# Patient Record
Sex: Female | Born: 1935 | Race: Black or African American | Hispanic: No | Marital: Single | Smoking: Never smoker
Health system: Southern US, Community
[De-identification: ages and names within clinical notes are randomized; demographics above are authoritative.]

## PROBLEM LIST (undated history)

## (undated) DIAGNOSIS — F0390 Unspecified dementia without behavioral disturbance: Secondary | ICD-10-CM

## (undated) DIAGNOSIS — E119 Type 2 diabetes mellitus without complications: Secondary | ICD-10-CM

## (undated) DIAGNOSIS — I10 Essential (primary) hypertension: Secondary | ICD-10-CM

## (undated) DIAGNOSIS — F209 Schizophrenia, unspecified: Secondary | ICD-10-CM

## (undated) DIAGNOSIS — O2230 Deep phlebothrombosis in pregnancy, unspecified trimester: Secondary | ICD-10-CM

---

## 2016-04-26 ENCOUNTER — Emergency Department (HOSPITAL_COMMUNITY)
Admission: EM | Admit: 2016-04-26 | Discharge: 2016-04-26 | Disposition: A | Discharge status: Home / Self Care | Payer: Medicare Other | Attending: Emergency Medicine | Admitting: Emergency Medicine

## 2016-04-26 ENCOUNTER — Encounter (HOSPITAL_COMMUNITY): Payer: Self-pay | Admitting: Emergency Medicine

## 2016-04-26 ENCOUNTER — Emergency Department (HOSPITAL_COMMUNITY): Payer: Medicare Other

## 2016-04-26 DIAGNOSIS — I10 Essential (primary) hypertension: Secondary | ICD-10-CM | POA: Insufficient documentation

## 2016-04-26 DIAGNOSIS — E119 Type 2 diabetes mellitus without complications: Secondary | ICD-10-CM | POA: Diagnosis not present

## 2016-04-26 DIAGNOSIS — F919 Conduct disorder, unspecified: Secondary | ICD-10-CM | POA: Diagnosis not present

## 2016-04-26 DIAGNOSIS — R531 Weakness: Secondary | ICD-10-CM | POA: Diagnosis present

## 2016-04-26 DIAGNOSIS — R930 Abnormal findings on diagnostic imaging of skull and head, not elsewhere classified: Secondary | ICD-10-CM | POA: Insufficient documentation

## 2016-04-26 DIAGNOSIS — R4689 Other symptoms and signs involving appearance and behavior: Secondary | ICD-10-CM

## 2016-04-26 HISTORY — DX: Schizophrenia, unspecified: F20.9

## 2016-04-26 HISTORY — DX: Unspecified dementia, unspecified severity, without behavioral disturbance, psychotic disturbance, mood disturbance, and anxiety: F03.90

## 2016-04-26 HISTORY — DX: Essential (primary) hypertension: I10

## 2016-04-26 HISTORY — DX: Deep phlebothrombosis in pregnancy, unspecified trimester: O22.30

## 2016-04-26 HISTORY — DX: Type 2 diabetes mellitus without complications: E11.9

## 2016-04-26 LAB — COMPREHENSIVE METABOLIC PANEL
ALBUMIN: 3.4 g/dL — AB (ref 3.5–5.0)
ALT: 16 U/L (ref 14–54)
ANION GAP: 8 (ref 5–15)
AST: 38 U/L (ref 15–41)
Alkaline Phosphatase: 62 U/L (ref 38–126)
BUN: 20 mg/dL (ref 6–20)
CHLORIDE: 104 mmol/L (ref 101–111)
CO2: 29 mmol/L (ref 22–32)
Calcium: 9.4 mg/dL (ref 8.9–10.3)
Creatinine, Ser: 1.21 mg/dL — ABNORMAL HIGH (ref 0.44–1.00)
GFR calc Af Amer: 48 mL/min — ABNORMAL LOW (ref >60)
GFR calc non Af Amer: 41 mL/min — ABNORMAL LOW (ref >60)
GLUCOSE: 76 mg/dL (ref 65–99)
POTASSIUM: 4.4 mmol/L (ref 3.5–5.1)
SODIUM: 141 mmol/L (ref 135–145)
Total Bilirubin: 1 mg/dL (ref 0.3–1.2)
Total Protein: 7.8 g/dL (ref 6.5–8.1)

## 2016-04-26 LAB — CBC
HCT: 30.8 % — ABNORMAL LOW (ref 36.0–46.0)
Hemoglobin: 10.6 g/dL — ABNORMAL LOW (ref 12.0–15.0)
MCH: 31.9 pg (ref 26.0–34.0)
MCHC: 34.4 g/dL (ref 30.0–36.0)
MCV: 92.8 fL (ref 78.0–100.0)
PLATELETS: 181 10*3/uL (ref 150–400)
RBC: 3.32 MIL/uL — AB (ref 3.87–5.11)
RDW: 16.9 % — ABNORMAL HIGH (ref 11.5–15.5)
WBC: 5.1 10*3/uL (ref 4.0–10.5)

## 2016-04-26 LAB — URINALYSIS, ROUTINE W REFLEX MICROSCOPIC
Bilirubin Urine: NEGATIVE
Glucose, UA: NEGATIVE mg/dL
Hgb urine dipstick: NEGATIVE
Ketones, ur: NEGATIVE mg/dL
LEUKOCYTES UA: NEGATIVE
NITRITE: NEGATIVE
PROTEIN: NEGATIVE mg/dL
Specific Gravity, Urine: 1.011 (ref 1.005–1.030)
pH: 8 (ref 5.0–8.0)

## 2016-04-26 LAB — CBG MONITORING, ED: GLUCOSE-CAPILLARY: 70 mg/dL (ref 65–99)

## 2016-04-26 LAB — TROPONIN I
TROPONIN I: 0.03 ng/mL — AB (ref <0.03)
Troponin I: 0.03 ng/mL (ref <0.03)

## 2016-04-26 LAB — VALPROIC ACID LEVEL: Valproic Acid Lvl: 102 ug/mL — ABNORMAL HIGH (ref 50.0–100.0)

## 2016-04-26 NOTE — ED Notes (Signed)
Bed: WHALD Expected date:  Expected time:  Means of arrival:  Comments: 

## 2016-04-26 NOTE — ED Notes (Signed)
In and out completed 1121 by Miachel RouxJonna NT and Megan EMT.

## 2016-04-26 NOTE — ED Notes (Signed)
Nurse is in the room starting a ultrasound IV

## 2016-04-26 NOTE — ED Notes (Signed)
RN attempted to start a line and collect blood work but was unable to.

## 2016-04-26 NOTE — ED Notes (Signed)
Called [facility] to inquire wait time verbalizes about an hour until [facility] able to transport pt related to wait time.

## 2016-04-26 NOTE — ED Notes (Signed)
Unsuccessful IV attempt x 2.  Ati RN to attempt.

## 2016-04-26 NOTE — ED Notes (Signed)
[facility] notified of transport back to [facility].

## 2016-04-26 NOTE — ED Notes (Signed)
Nease charge RN at bedside at present time attempt IV start.

## 2016-04-26 NOTE — ED Notes (Signed)
Faith Wright verbalizes will collect urine via in and out.

## 2016-04-26 NOTE — ED Notes (Addendum)
Attempt IV twice unsuccessful. Rees at bedside. Nease charge RN verbalizes will attempt IV.

## 2016-04-26 NOTE — ED Notes (Addendum)
Per [facility] staff Adele Barthelatricia Harrington is contact person, niece, number is [phone].  Madilyn Hookees MD notified that contact person found per facility.

## 2016-04-26 NOTE — ED Notes (Signed)
Nease charge RN attempt IV twice unsuccessful. Ati RN at bedside attempting IV at present time.

## 2016-04-26 NOTE — Discharge Instructions (Signed)
The cause of your symptoms was not identified today.  Your creatinine (kidney function was slightly elevated) as well as your depakote level.  Please follow up with your family doctor as soon as possible for recheck and to see if your labs are close to their normal ranges.  Get rechecked immediately if you develop any new or worsening symptoms.

## 2016-04-26 NOTE — ED Notes (Signed)
Per Madilyn Hookees called [facility] to investigate pt complaint. Staff verbalize "heard from someone that she shook for a few seconds during breakfast." Staff state have not contacted family related to no numbers on file. First time pt seen here, [facility] or other [facility], and have no information other than paperwork sent from facility. Pt alert to self not date of birth reports [birth year], situation at times, knows is at the hospital but not where/location, reports year 2002. Pt known hx of dementia and norm to be oriented to self only per EMS. With assessment pt verbalizes nervous and weak while eating breakfast this morning but unspecific otherwise. Pt denies pain and resting comfortably with warm blanket at present time. Nease charge RN continues to attempt IV, on 2nd attempt.

## 2016-04-26 NOTE — ED Provider Notes (Signed)
[facility]-EMERGENCY DEPT Provider Note   CSN: [id number] Arrival date & time: 04/26/16  [id number]     History   Chief Complaint Chief Complaint  Patient presents with  . Generalized Weakness    HPI Faith Wright is a [age] y.o. female.  The history is provided by the patient. No language interpreter was used.    Faith PewMarie Peckinpaugh is a 82 y.o. female who presents to the Emergency Department complaining of weakness.  Level V caveat due to dementia.  Per EMS report patient is from [facility] and had generalized weakness during breakfast this morning. Per patient she states that she felt poorly while eating breakfast described as a feeling bad and anxious type feeling. She states she felt poorly for 30 minutes and now she feels at her baseline. She denies any pain, nausea, vomiting.  Contacted [facility], they state that she had a shaking episode while she was in the dining room. Per description she was shaking all over and was speaking saying that she could not move. Episode lasted a few minutes.  Past Medical History:  Diagnosis Date  . Dementia   . Diabetes mellitus without complication (HCC)   . DVT (deep vein thrombosis) in pregnancy (HCC)   . Hypertension   . Schizophrenia (HCC)     There are no active problems to display for this patient.   History reviewed. No pertinent surgical history.  OB History    No data available       Home Medications    Prior to Admission medications   Not on File    Family History No family history on file.  Social History Social History  Substance Use Topics  . Smoking status: Unknown If Ever Smoked  . Smokeless tobacco: Not on file  . Alcohol use No     Allergies   Patient has no known allergies.   Review of Systems Review of Systems  All other systems reviewed and are negative.    Physical Exam Updated Vital Signs BP 186/68 (BP Location: Left Arm)   Pulse 78   Temp 97.8 F (36.6 C) (Oral)   Resp 16   SpO2 97%    Physical Exam  Constitutional: She appears well-developed and well-nourished.  HENT:  Head: Normocephalic and atraumatic.  Cardiovascular: Normal rate and regular rhythm.   No murmur heard. Pulmonary/Chest: Effort normal and breath sounds normal. No respiratory distress.  Abdominal: Soft. There is no tenderness. There is no rebound and no guarding.  Musculoskeletal: She exhibits no tenderness.  Trace pitting edema to BLE.  Muscular atrophy of BLE.    Neurological: She is alert.  Disoriented to place and time. 3+ out of 5 strength in bilateral lower extremities. 4 out of 5 strength in bilateral upper extremities.  Skin: Skin is warm and dry. Capillary refill takes less than 2 seconds.  Psychiatric: She has a normal mood and affect. Her behavior is normal.  Nursing note and vitals reviewed.    ED Treatments / Results  Labs (all labs ordered are listed, but only abnormal results are displayed) Labs Reviewed  BASIC METABOLIC PANEL  CBC  URINALYSIS, ROUTINE W REFLEX MICROSCOPIC (NOT AT [facility])  CBG MONITORING, ED    EKG  EKG Interpretation None       Radiology No results found.  Procedures Procedures (including critical care time)  Medications Ordered in ED Medications - No data to display   Initial Impression / Assessment and Plan / ED Course  I have reviewed the  triage vital signs and the nursing notes.  Pertinent labs & imaging results that were available during my care of the patient were reviewed by me and considered in my medical decision making (see chart for details).  Clinical Course     Patient here following an episode of shaking and feeling poorly. She is currently at her baseline in the emergency department and has no complaints. Attempted to contact patient's niece who is listed as a contact [facility] but unable to reach anyone. Her labs do show some mild abnormalities with no priors available for comparison. Presentation is not consistent with  acute coronary syndrome, PE, CVA. Plan to DC home with close outpatient follow-up with her PCP so lab reconciliation and further evaluation can occur.  Final Clinical Impressions(s) / ED Diagnoses   Final diagnoses:  Spell of abnormal behavior    New Prescriptions New Prescriptions   No medications on file     Tilden FossaElizabeth Dick Hark, MD 04/27/16 1324

## 2016-04-26 NOTE — ED Notes (Signed)
Pt gown, fill linen, and brief changed. Pt ate 100% of meal given. Pt transported to [facility] to wait for [facility] transport.

## 2016-04-26 NOTE — ED Notes (Signed)
Pt saturated in urine. Full linen changed, pericare provided, and brief applied.

## 2016-04-26 NOTE — ED Notes (Signed)
Bed: WA14 Expected date:  Expected time:  Means of arrival:  Comments: EMS 

## 2016-04-26 NOTE — ED Triage Notes (Signed)
Per EMS pt from [facility] request evaluation of generalized weakness onset this morning noted at breakfast. Hx of dementia; alert to self only.

## 2016-06-01 ENCOUNTER — Emergency Department (HOSPITAL_COMMUNITY): Payer: Medicare Other

## 2016-06-01 ENCOUNTER — Encounter (HOSPITAL_COMMUNITY): Payer: Self-pay | Admitting: Emergency Medicine

## 2016-06-01 ENCOUNTER — Emergency Department (HOSPITAL_COMMUNITY)
Admission: EM | Admit: 2016-06-01 | Discharge: 2016-06-01 | Disposition: A | Discharge status: Home / Self Care | Payer: Medicare Other | Attending: Emergency Medicine | Admitting: Emergency Medicine

## 2016-06-01 DIAGNOSIS — Y92129 Unspecified place in nursing home as the place of occurrence of the external cause: Secondary | ICD-10-CM | POA: Insufficient documentation

## 2016-06-01 DIAGNOSIS — W19XXXA Unspecified fall, initial encounter: Secondary | ICD-10-CM

## 2016-06-01 DIAGNOSIS — S7001XA Contusion of right hip, initial encounter: Secondary | ICD-10-CM | POA: Diagnosis not present

## 2016-06-01 DIAGNOSIS — Y939 Activity, unspecified: Secondary | ICD-10-CM | POA: Diagnosis not present

## 2016-06-01 DIAGNOSIS — Z7984 Long term (current) use of oral hypoglycemic drugs: Secondary | ICD-10-CM | POA: Insufficient documentation

## 2016-06-01 DIAGNOSIS — Y999 Unspecified external cause status: Secondary | ICD-10-CM | POA: Insufficient documentation

## 2016-06-01 DIAGNOSIS — Z79899 Other long term (current) drug therapy: Secondary | ICD-10-CM | POA: Insufficient documentation

## 2016-06-01 DIAGNOSIS — E119 Type 2 diabetes mellitus without complications: Secondary | ICD-10-CM | POA: Insufficient documentation

## 2016-06-01 DIAGNOSIS — Z7982 Long term (current) use of aspirin: Secondary | ICD-10-CM | POA: Insufficient documentation

## 2016-06-01 DIAGNOSIS — S79911A Unspecified injury of right hip, initial encounter: Secondary | ICD-10-CM | POA: Diagnosis present

## 2016-06-01 DIAGNOSIS — I10 Essential (primary) hypertension: Secondary | ICD-10-CM | POA: Insufficient documentation

## 2016-06-01 DIAGNOSIS — W1830XA Fall on same level, unspecified, initial encounter: Secondary | ICD-10-CM | POA: Insufficient documentation

## 2016-06-01 DIAGNOSIS — S7000XA Contusion of unspecified hip, initial encounter: Secondary | ICD-10-CM

## 2016-06-01 LAB — CBG MONITORING, ED: GLUCOSE-CAPILLARY: 135 mg/dL — AB (ref 65–99)

## 2016-06-01 NOTE — Progress Notes (Signed)
Pt's Faith Wright snf forms indicates her pcp is Otelia SergeantDavid Curly  [facility] pcp information is updated

## 2016-06-01 NOTE — ED Triage Notes (Addendum)
Pt from [facility]. Hx of dementia per history. Facility reports pt is normally alert and oriented 4x, but today only oriented to self.  Pt had a fall at the facility today. Was witnessed by roommate who refused to speak with EMS. Pt normally non-ambulatory, but staff reports that pt's R hip looks different than normal today. Pt will not straighten legs (unusual for her) and has pain with passive extension.  Also has swelling on L foot. Pt also complained of lumbar pain with EMS.  Hx of recurrent UTI. Recently finished abx. Pt otherwise complains of abd pain.

## 2016-06-01 NOTE — ED Notes (Signed)
Bed: [facility]ALA Expected date:  Expected time:  Means of arrival:  Comments: No bed. 

## 2016-06-01 NOTE — ED Notes (Signed)
[facility] called for transport.  

## 2016-06-01 NOTE — ED Notes (Signed)
Bed: WA03 Expected date:  Expected time:  Means of arrival:  Comments: EMS-[facility]-fall/hip deformity

## 2016-06-01 NOTE — ED Provider Notes (Signed)
[facility]-EMER[facility] DEPT Provider Note   CSN: [id number] Arrival date & time: 06/01/16  [id number]     History   Chief Complaint Chief Complaint  Patient presents with  . Hip Pain  . Fall    HPI Faith Wright is a 82 y.o. female.  Patient is an 82 year old female with past medical history of dementia, schizophrenia, diabetes, and hypertension. She presents for evaluation of a fall. She is a resident of a nursing facility where she was apparently found on the floor. The patient offers little history, however appears to be having pain in her right hip.    Hip Pain   Fall     Past Medical History:  Diagnosis Date  . Dementia   . Diabetes mellitus without complication (HCC)   . DVT (deep vein thrombosis) in pregnancy (HCC)   . Hypertension   . Schizophrenia (HCC)     There are no active problems to display for this patient.   History reviewed. No pertinent surgical history.  OB History    No data available       Home Medications    Prior to Admission medications   Medication Sig Start Date End Date Taking? Authorizing Provider  amLODipine (NORVASC) 5 MG tablet Take 5 mg by mouth every morning.    Historical Provider, MD  aspirin EC 81 MG tablet Take 81 mg by mouth every morning.    Historical Provider, MD  benazepril (LOTENSIN) 20 MG tablet Take 20 mg by mouth every morning.    Historical Provider, MD  divalproex (DEPAKOTE) 500 MG DR tablet Take 500 mg by mouth 2 (two) times daily.    Historical Provider, MD  donepezil (ARICEPT) 10 MG tablet Take 10 mg by mouth at bedtime.    Historical Provider, MD  Lurasidone HCl (LATUDA) 60 MG TABS Take 60 mg by mouth every morning.    Historical Provider, MD  metFORMIN (GLUCOPHAGE) 500 MG tablet Take 500 mg by mouth 2 (two) times daily with a meal.    Historical Provider, MD  polyvinyl alcohol-povidone (HYPOTEARS) 1.4-0.6 % ophthalmic solution Place 1-2 drops into both eyes 3 (three) times daily.    Historical Provider, MD    pravastatin (PRAVACHOL) 40 MG tablet Take 40 mg by mouth at bedtime.    Historical Provider, MD  vitamin B-12 (CYANOCOBALAMIN) 1000 MCG tablet Take 1,000 mcg by mouth every morning.    Historical Provider, MD    Family History History reviewed. No pertinent family history.  Social History Social History  Substance Use Topics  . Smoking status: Unknown If Ever Smoked  . Smokeless tobacco: Not on file  . Alcohol use No     Allergies   Patient has no known allergies.   Review of Systems Review of Systems  Unable to perform ROS: Dementia     Physical Exam Updated Vital Signs BP 199/94 (BP Location: Right Arm)   Pulse 80   Temp 98.5 F (36.9 C) (Oral)   Resp 16   SpO2 93%   Physical Exam  Constitutional: She appears well-developed and well-nourished. No distress.  HENT:  Head: Normocephalic and atraumatic.  Neck: Normal range of motion. Neck supple.  Cardiovascular: Normal rate and regular rhythm.  Exam reveals no gallop and no friction rub.   No murmur heard. Pulmonary/Chest: Effort normal and breath sounds normal. No respiratory distress. She has no wheezes.  Abdominal: Soft. Bowel sounds are normal. She exhibits no distension. There is no tenderness.  Musculoskeletal: Normal range of motion.  There appears to be tenderness over the right hip, however no obvious deformity. She has good range of motion. Distal PMS is intact.  Neurological: She is alert. No cranial nerve deficit. She exhibits normal muscle tone. Coordination normal.  Patient is awake, alert, and pleasant. She is not oriented to time, place, or situation. She moves all extremities and follows commands. She responds to questions appropriately.  Skin: Skin is warm and dry. She is not diaphoretic.  Nursing note and vitals reviewed.    ED Treatments / Results  Labs (all labs ordered are listed, but only abnormal results are displayed) Labs Reviewed  CBG MONITORING, ED - Abnormal; Notable for the  following:       Result Value   Glucose-Capillary 135 (*)    All other components within normal limits    EKG  EKG Interpretation None       Radiology No results found.  Procedures Procedures (including critical care time)  Medications Ordered in ED Medications - No data to display   Initial Impression / Assessment and Plan / ED Course  I have reviewed the triage vital signs and the nursing notes.  Pertinent labs & imaging results that were available during my care of the patient were reviewed by me and considered in my medical decision making (see chart for details).  Clinical Course     Patient brought here by EMS after a fall at a nursing home. I am unable to identify any obvious hip fracture on x-ray. She is somewhat demented which limits history somewhat, however she is not reporting to me any obvious complaints. She appears to be moving all 4 extremities and from what I can tell in prior notes, is at her baseline. I see no indication for further imaging and believe she is appropriate for discharge.  Final Clinical Impressions(s) / ED Diagnoses   Final diagnoses:  None    New Prescriptions New Prescriptions   No medications on file     Geoffery Lyonsouglas Garritt Molyneux, MD 06/01/16 1116

## 2016-06-01 NOTE — Progress Notes (Signed)
ED CM consulted by pt's community hosp[facility] staff to get updated States the family had discussed they had not received an update EDP completing ED notes as hospice staff called at 1116 therefore discharge order populated  Confirmed with ED clerk that ED RN notify facility of update and facility updates family

## 2016-06-04 ENCOUNTER — Emergency Department (HOSPITAL_COMMUNITY)
Admission: EM | Admit: 2016-06-04 | Discharge: 2016-06-04 | Disposition: A | Discharge status: Home / Self Care | Payer: Medicare Other | Attending: Emergency Medicine | Admitting: Emergency Medicine

## 2016-06-04 ENCOUNTER — Emergency Department (HOSPITAL_COMMUNITY): Payer: Medicare Other

## 2016-06-04 ENCOUNTER — Encounter (HOSPITAL_COMMUNITY): Payer: Self-pay | Admitting: Emergency Medicine

## 2016-06-04 DIAGNOSIS — I10 Essential (primary) hypertension: Secondary | ICD-10-CM | POA: Insufficient documentation

## 2016-06-04 DIAGNOSIS — W19XXXA Unspecified fall, initial encounter: Secondary | ICD-10-CM | POA: Diagnosis not present

## 2016-06-04 DIAGNOSIS — Y939 Activity, unspecified: Secondary | ICD-10-CM | POA: Diagnosis not present

## 2016-06-04 DIAGNOSIS — M25561 Pain in right knee: Secondary | ICD-10-CM | POA: Insufficient documentation

## 2016-06-04 DIAGNOSIS — Y929 Unspecified place or not applicable: Secondary | ICD-10-CM | POA: Insufficient documentation

## 2016-06-04 DIAGNOSIS — E119 Type 2 diabetes mellitus without complications: Secondary | ICD-10-CM | POA: Diagnosis not present

## 2016-06-04 DIAGNOSIS — Y999 Unspecified external cause status: Secondary | ICD-10-CM | POA: Insufficient documentation

## 2016-06-04 DIAGNOSIS — Z7982 Long term (current) use of aspirin: Secondary | ICD-10-CM | POA: Insufficient documentation

## 2016-06-04 DIAGNOSIS — M25562 Pain in left knee: Secondary | ICD-10-CM | POA: Diagnosis not present

## 2016-06-04 LAB — CBC WITH DIFFERENTIAL/PLATELET
BASOS ABS: 0 10*3/uL (ref 0.0–0.1)
Basophils Relative: 0 %
EOS PCT: 0 %
Eosinophils Absolute: 0 10*3/uL (ref 0.0–0.7)
HCT: 28.5 % — ABNORMAL LOW (ref 36.0–46.0)
Hemoglobin: 9.5 g/dL — ABNORMAL LOW (ref 12.0–15.0)
LYMPHS PCT: 19 %
Lymphs Abs: 1.7 10*3/uL (ref 0.7–4.0)
MCH: 32 pg (ref 26.0–34.0)
MCHC: 33.3 g/dL (ref 30.0–36.0)
MCV: 96 fL (ref 78.0–100.0)
Monocytes Absolute: 0.7 10*3/uL (ref 0.1–1.0)
Monocytes Relative: 8 %
Neutro Abs: 6.6 10*3/uL (ref 1.7–7.7)
Neutrophils Relative %: 73 %
Platelets: 243 10*3/uL (ref 150–400)
RBC: 2.97 MIL/uL — AB (ref 3.87–5.11)
RDW: 15.9 % — ABNORMAL HIGH (ref 11.5–15.5)
WBC: 9 10*3/uL (ref 4.0–10.5)

## 2016-06-04 LAB — BASIC METABOLIC PANEL
ANION GAP: 8 (ref 5–15)
BUN: 27 mg/dL — AB (ref 6–20)
CO2: 30 mmol/L (ref 22–32)
Calcium: 9 mg/dL (ref 8.9–10.3)
Chloride: 105 mmol/L (ref 101–111)
Creatinine, Ser: 1 mg/dL (ref 0.44–1.00)
GFR, EST NON AFRICAN AMERICAN: 52 mL/min — AB (ref >60)
Glucose, Bld: 157 mg/dL — ABNORMAL HIGH (ref 65–99)
POTASSIUM: 3.8 mmol/L (ref 3.5–5.1)
SODIUM: 143 mmol/L (ref 135–145)

## 2016-06-04 LAB — I-STAT TROPONIN, ED: TROPONIN I, POC: 0 ng/mL (ref 0.00–0.08)

## 2016-06-04 MED ORDER — ACETAMINOPHEN 325 MG PO TABS
650.0000 mg | ORAL_TABLET | Freq: Once | ORAL | Status: AC
  Administered 2016-06-04: 650 mg via ORAL
  Filled 2016-06-04: qty 2

## 2016-06-04 NOTE — ED Provider Notes (Signed)
[facility]-EM[facility] DEPT Provider Note   CSN: [id number] Arrival date & time: 06/04/16  1618     History   Chief Complaint Chief Complaint  Patient presents with  . Knee Pain  . Fall    HPI Faith Wright is a 82 y.o. female.  HPI  Remainder of history, ROS, and physical exam limited due to patient's condition (dementia).  Level V Caveat.  Patient sent here by her skilled nursing facility for left knee pain and noted bruising of the left knee after a fall 3 days ago.  No other falls. Patient currently at baseline.   Past Medical History:  Diagnosis Date  . Dementia   . Diabetes mellitus without complication (HCC)   . DVT (deep vein thrombosis) in pregnancy (HCC)   . Hypertension   . Schizophrenia (HCC)     There are no active problems to display for this patient.   History reviewed. No pertinent surgical history.  OB History    No data available       Home Medications    Prior to Admission medications   Medication Sig Start Date End Date Taking? Authorizing Provider  acetaminophen (TYLENOL) 325 MG tablet Take 650 mg by mouth 2 (two) times daily as needed for mild pain, moderate pain, fever or headache.   Yes Historical Provider, MD  acetaminophen (TYLENOL) 500 MG tablet Take 1,000 mg by mouth 3 (three) times daily as needed for mild pain, moderate pain, fever or headache.   Yes Historical Provider, MD  aspirin EC 81 MG tablet Take 81 mg by mouth every morning.   Yes Historical Provider, MD  benazepril (LOTENSIN) 10 MG tablet Take 10 mg by mouth daily with breakfast.   Yes Historical Provider, MD  benzonatate (TESSALON) 100 MG capsule Take 100 mg by mouth 3 (three) times daily as needed for cough.   Yes Historical Provider, MD  citalopram (CELEXA) 10 MG tablet Take 10 mg by mouth daily with breakfast.   Yes Historical Provider, MD  divalproex (DEPAKOTE) 500 MG DR tablet Take 500 mg by mouth 2 (two) times daily.   Yes Historical Provider, MD  loratadine (CLARITIN) 10  MG tablet Take 10 mg by mouth daily as needed for allergies.   Yes Historical Provider, MD  Lurasidone HCl (LATUDA) 60 MG TABS Take 60 mg by mouth every morning.   Yes Historical Provider, MD  metFORMIN (GLUCOPHAGE) 500 MG tablet Take 500 mg by mouth daily with breakfast.    Yes Historical Provider, MD  polyvinyl alcohol-povidone (HYPOTEARS) 1.4-0.6 % ophthalmic solution Place 1 drop into both eyes 3 (three) times daily.    Yes Historical Provider, MD  vitamin B-12 (CYANOCOBALAMIN) 1000 MCG tablet Take 1,000 mcg by mouth every morning.   Yes Historical Provider, MD    Family History No family history on file.  Social History Social History  Substance Use Topics  . Smoking status: Unknown If Ever Smoked  . Smokeless tobacco: Never Used  . Alcohol use No     Allergies   Patient has no known allergies.   Review of Systems Review of Systems  Unable to perform ROS: Dementia     Physical Exam Updated Vital Signs BP 188/86 (BP Location: Left Arm)   Pulse 100   Temp 98.6 F (37 C) (Oral)   Resp 18   SpO2 100%   Vitals:   06/04/16 2000 06/04/16 2100 06/04/16 2130 06/04/16 2213  BP: 188/86 (!) 193/104 189/87 188/86  Pulse:    100  Resp: 17 25 22 18   Temp:    98.6 F (37 C)  TempSrc:    Oral  SpO2:    100%    Physical Exam  Constitutional: She is oriented to person, place, and time. She appears well-developed and well-nourished. No distress.  HENT:  Head: Normocephalic and atraumatic.  Nose: Nose normal.  Eyes: Conjunctivae and EOM are normal. Pupils are equal, round, and reactive to light. Right eye exhibits no discharge. Left eye exhibits no discharge. No scleral icterus.  Neck: Normal range of motion. Neck supple.  Cardiovascular: Normal rate and regular rhythm.  Exam reveals no gallop and no friction rub.   No murmur heard. Pulmonary/Chest: Effort normal and breath sounds normal. No stridor. No respiratory distress. She has no rales.  Abdominal: Soft. She exhibits  no distension. There is no tenderness.  Musculoskeletal: She exhibits no edema.       Right knee: Tenderness found.       Left knee: Tenderness found.  Neurological: She is alert and oriented to person, place, and time.  Skin: Skin is warm and dry. No rash noted. She is not diaphoretic. No erythema.  Psychiatric: She has a normal mood and affect.  Vitals reviewed.    ED Treatments / Results  Labs (all labs ordered are listed, but only abnormal results are displayed) Labs Reviewed  BASIC METABOLIC PANEL - Abnormal; Notable for the following:       Result Value   Glucose, Bld 157 (*)    BUN 27 (*)    GFR calc non Af Amer 52 (*)    All other components within normal limits  CBC WITH DIFFERENTIAL/PLATELET - Abnormal; Notable for the following:    RBC 2.97 (*)    Hemoglobin 9.5 (*)    HCT 28.5 (*)    RDW 15.9 (*)    All other components within normal limits  I-STAT TROPOININ, ED    EKG  EKG Interpretation  Date/Time:  Friday June 04 2016 20:37:14 EST Ventricular Rate:  93 PR Interval:    QRS Duration: 82 QT Interval:  348 QTC Calculation: 433 R Axis:   -26 Text Interpretation:  Sinus rhythm Probable left atrial enlargement LVH with secondary repolarization abnormality Anterior Q waves, possibly due to LVH No significant change since last tracing Confirmed by [facility] MD, Josefita Weissmann ([id number]) on 06/04/2016 9:18:05 PM       Radiology Dg Knee 2 Views Left  Result Date: 06/04/2016 CLINICAL DATA:  Larey SeatFell 3 days ago.  Ecchymosis and soreness. EXAM: LEFT KNEE - 1-2 VIEW COMPARISON:  None. FINDINGS: Marked study limitations due to flexion contractures and positioning challenges. No acute fracture or dislocation is evident. No radiopaque foreign body. No soft tissue gas. IMPRESSION: Limited study, negative for significant abnormality. Electronically Signed   By: Ellery Plunkaniel R Mitchell M.D.   On: 06/04/2016 19:16   Dg Knee 2 Views Right  Result Date: 06/04/2016 CLINICAL DATA:  Larey SeatFell  several days ago.  Swelling and ecchymosis. EXAM: RIGHT KNEE - 1-2 VIEW COMPARISON:  None. FINDINGS: Significant study limitations due to flexion contractures and positioning challenges. No fracture or dislocation is evident. No soft tissue gas or foreign body. IMPRESSION: Limited study, negative for significant abnormality. Electronically Signed   By: Ellery Plunkaniel R Mitchell M.D.   On: 06/04/2016 19:25    Procedures Procedures (including critical care time)  Medications Ordered in ED Medications  acetaminophen (TYLENOL) tablet 650 mg (650 mg Oral Given 06/04/16 2210)     Initial Impression / Assessment  and Plan / ED Course  I have reviewed the triage vital signs and the nursing notes.  Pertinent labs & imaging results that were available during my care of the patient were reviewed by me and considered in my medical decision making (see chart for details).  Clinical Course as of Jun 06 323  Fri Jun 04, 2016  1900 Plain films of the knees without evidence of acute injury.  [PC]  2201 Labs without evidence of end organ damage. EKG without acute ischemic changes. Patient already on ACE inhibitor. Will need to follow up with primary care provider for appropriate control of hypertension.  [PC]  2201 The patient is safe for discharge with strict return precautions.   [PC]    Clinical Course User Index [PC] Nira Conn, MD      Final Clinical Impressions(s) / ED Diagnoses   Final diagnoses:  Acute pain of both knees  Hypertension, unspecified type   Disposition: Discharge  Condition: Good  I have discussed the results, Dx and Tx plan with the patient who expressed understanding and agree(s) with the plan. Discharge instructions discussed at great length. The patient was given strict return precautions who verbalized understanding of the instructions. No further questions at time of discharge.    Discharge Medication List as of 06/04/2016 10:00 PM      Follow Up: Mortimer Fries, PA [address] [place] [address] [phone]  Schedule an appointment as soon as possible for a visit  For close follow up to assess for uncontrolled hypertension      Nira Conn, MD 06/05/16 [phone]

## 2016-06-04 NOTE — ED Triage Notes (Signed)
Pt from [facility] via [facility] for recheck on L knee after being seen here for a fall 3 days ago. Facility staff reports that pt has bruising to L knee and soreness.  Pt is at baseline per facility with hx of dementia. Pt in NAD. Pt is hypertensive with hx of the same. Facility reports that pt has been taking BP meds

## 2016-06-04 NOTE — ED Notes (Signed)
[facility] called and report was given to [place] at [facility].

## 2016-06-10 ENCOUNTER — Inpatient Hospital Stay (HOSPITAL_COMMUNITY)
Admission: EM | Admit: 2016-06-10 | Discharge: 2016-06-16 | DRG: 871 | Disposition: A | Discharge status: Hospice | Payer: Medicare Other | Attending: Family Medicine | Admitting: Family Medicine

## 2016-06-10 ENCOUNTER — Encounter (HOSPITAL_COMMUNITY): Payer: Self-pay | Admitting: Emergency Medicine

## 2016-06-10 ENCOUNTER — Inpatient Hospital Stay (HOSPITAL_COMMUNITY): Payer: Medicare Other

## 2016-06-10 ENCOUNTER — Emergency Department (HOSPITAL_COMMUNITY): Payer: Medicare Other

## 2016-06-10 DIAGNOSIS — Z515 Encounter for palliative care: Secondary | ICD-10-CM | POA: Diagnosis present

## 2016-06-10 DIAGNOSIS — E860 Dehydration: Secondary | ICD-10-CM | POA: Diagnosis present

## 2016-06-10 DIAGNOSIS — N843 Polyp of vulva: Secondary | ICD-10-CM | POA: Diagnosis not present

## 2016-06-10 DIAGNOSIS — F0390 Unspecified dementia without behavioral disturbance: Secondary | ICD-10-CM | POA: Insufficient documentation

## 2016-06-10 DIAGNOSIS — R630 Anorexia: Secondary | ICD-10-CM | POA: Diagnosis not present

## 2016-06-10 DIAGNOSIS — D539 Nutritional anemia, unspecified: Secondary | ICD-10-CM | POA: Diagnosis present

## 2016-06-10 DIAGNOSIS — F203 Undifferentiated schizophrenia: Secondary | ICD-10-CM

## 2016-06-10 DIAGNOSIS — F0280 Dementia in other diseases classified elsewhere without behavioral disturbance: Secondary | ICD-10-CM | POA: Diagnosis present

## 2016-06-10 DIAGNOSIS — I4891 Unspecified atrial fibrillation: Secondary | ICD-10-CM | POA: Diagnosis not present

## 2016-06-10 DIAGNOSIS — Z7189 Other specified counseling: Secondary | ICD-10-CM

## 2016-06-10 DIAGNOSIS — A419 Sepsis, unspecified organism: Secondary | ICD-10-CM | POA: Diagnosis present

## 2016-06-10 DIAGNOSIS — G309 Alzheimer's disease, unspecified: Secondary | ICD-10-CM | POA: Diagnosis present

## 2016-06-10 DIAGNOSIS — F209 Schizophrenia, unspecified: Secondary | ICD-10-CM | POA: Diagnosis present

## 2016-06-10 DIAGNOSIS — Z79891 Long term (current) use of opiate analgesic: Secondary | ICD-10-CM

## 2016-06-10 DIAGNOSIS — I480 Paroxysmal atrial fibrillation: Secondary | ICD-10-CM | POA: Diagnosis present

## 2016-06-10 DIAGNOSIS — E861 Hypovolemia: Secondary | ICD-10-CM | POA: Diagnosis present

## 2016-06-10 DIAGNOSIS — I10 Essential (primary) hypertension: Secondary | ICD-10-CM | POA: Diagnosis not present

## 2016-06-10 DIAGNOSIS — R9431 Abnormal electrocardiogram [ECG] [EKG]: Secondary | ICD-10-CM | POA: Diagnosis not present

## 2016-06-10 DIAGNOSIS — K5900 Constipation, unspecified: Secondary | ICD-10-CM | POA: Diagnosis present

## 2016-06-10 DIAGNOSIS — G40909 Epilepsy, unspecified, not intractable, without status epilepticus: Secondary | ICD-10-CM

## 2016-06-10 DIAGNOSIS — E43 Unspecified severe protein-calorie malnutrition: Secondary | ICD-10-CM | POA: Diagnosis present

## 2016-06-10 DIAGNOSIS — R627 Adult failure to thrive: Secondary | ICD-10-CM | POA: Diagnosis present

## 2016-06-10 DIAGNOSIS — E876 Hypokalemia: Secondary | ICD-10-CM | POA: Diagnosis present

## 2016-06-10 DIAGNOSIS — E119 Type 2 diabetes mellitus without complications: Secondary | ICD-10-CM

## 2016-06-10 DIAGNOSIS — Z66 Do not resuscitate: Secondary | ICD-10-CM | POA: Diagnosis present

## 2016-06-10 DIAGNOSIS — Z9181 History of falling: Secondary | ICD-10-CM

## 2016-06-10 DIAGNOSIS — N179 Acute kidney failure, unspecified: Secondary | ICD-10-CM

## 2016-06-10 DIAGNOSIS — R740 Nonspecific elevation of levels of transaminase and lactic acid dehydrogenase [LDH]: Secondary | ICD-10-CM | POA: Diagnosis present

## 2016-06-10 DIAGNOSIS — Z7982 Long term (current) use of aspirin: Secondary | ICD-10-CM

## 2016-06-10 DIAGNOSIS — I080 Rheumatic disorders of both mitral and aortic valves: Secondary | ICD-10-CM | POA: Diagnosis present

## 2016-06-10 DIAGNOSIS — E1151 Type 2 diabetes mellitus with diabetic peripheral angiopathy without gangrene: Secondary | ICD-10-CM | POA: Diagnosis present

## 2016-06-10 DIAGNOSIS — E870 Hyperosmolality and hypernatremia: Secondary | ICD-10-CM | POA: Diagnosis present

## 2016-06-10 DIAGNOSIS — Z7984 Long term (current) use of oral hypoglycemic drugs: Secondary | ICD-10-CM

## 2016-06-10 DIAGNOSIS — Z79899 Other long term (current) drug therapy: Secondary | ICD-10-CM | POA: Diagnosis not present

## 2016-06-10 DIAGNOSIS — M6282 Rhabdomyolysis: Secondary | ICD-10-CM | POA: Diagnosis present

## 2016-06-10 DIAGNOSIS — R4182 Altered mental status, unspecified: Secondary | ICD-10-CM | POA: Diagnosis present

## 2016-06-10 DIAGNOSIS — B349 Viral infection, unspecified: Secondary | ICD-10-CM | POA: Diagnosis present

## 2016-06-10 DIAGNOSIS — R6521 Severe sepsis with septic shock: Secondary | ICD-10-CM | POA: Diagnosis present

## 2016-06-10 DIAGNOSIS — R748 Abnormal levels of other serum enzymes: Secondary | ICD-10-CM | POA: Diagnosis present

## 2016-06-10 DIAGNOSIS — R7401 Elevation of levels of liver transaminase levels: Secondary | ICD-10-CM | POA: Diagnosis present

## 2016-06-10 DIAGNOSIS — R509 Fever, unspecified: Secondary | ICD-10-CM

## 2016-06-10 DIAGNOSIS — R102 Pelvic and perineal pain: Secondary | ICD-10-CM | POA: Diagnosis present

## 2016-06-10 DIAGNOSIS — Z87440 Personal history of urinary (tract) infections: Secondary | ICD-10-CM

## 2016-06-10 LAB — COMPREHENSIVE METABOLIC PANEL
ALBUMIN: 2.5 g/dL — AB (ref 3.5–5.0)
ALK PHOS: 53 U/L (ref 38–126)
ALT: 25 U/L (ref 14–54)
AST: 58 U/L — AB (ref 15–41)
Anion gap: 17 — ABNORMAL HIGH (ref 5–15)
BILIRUBIN TOTAL: 0.9 mg/dL (ref 0.3–1.2)
BUN: 73 mg/dL — AB (ref 6–20)
CO2: 26 mmol/L (ref 22–32)
Calcium: 9.9 mg/dL (ref 8.9–10.3)
Chloride: 107 mmol/L (ref 101–111)
Creatinine, Ser: 2.2 mg/dL — ABNORMAL HIGH (ref 0.44–1.00)
GFR calc Af Amer: 23 mL/min — ABNORMAL LOW (ref >60)
GFR, EST NON AFRICAN AMERICAN: 20 mL/min — AB (ref >60)
GLUCOSE: 194 mg/dL — AB (ref 65–99)
POTASSIUM: 4.4 mmol/L (ref 3.5–5.1)
Sodium: 150 mmol/L — ABNORMAL HIGH (ref 135–145)
TOTAL PROTEIN: 7.7 g/dL (ref 6.5–8.1)

## 2016-06-10 LAB — URINALYSIS, ROUTINE W REFLEX MICROSCOPIC
Bacteria, UA: NONE SEEN
Bilirubin Urine: NEGATIVE
Glucose, UA: >=500 mg/dL — AB
Hgb urine dipstick: NEGATIVE
Ketones, ur: NEGATIVE mg/dL
NITRITE: NEGATIVE
PH: 5 (ref 5.0–8.0)
Protein, ur: NEGATIVE mg/dL
SPECIFIC GRAVITY, URINE: 1.021 (ref 1.005–1.030)

## 2016-06-10 LAB — POC OCCULT BLOOD, ED: FECAL OCCULT BLD: NEGATIVE

## 2016-06-10 LAB — I-STAT CG4 LACTIC ACID, ED
Lactic Acid, Venous: 4.76 mmol/L (ref 0.5–1.9)
Lactic Acid, Venous: 0.98 mmol/L (ref 0.5–1.9)

## 2016-06-10 LAB — TYPE AND SCREEN
ABO/RH(D): A POS
ANTIBODY SCREEN: NEGATIVE

## 2016-06-10 LAB — CBC WITH DIFFERENTIAL/PLATELET
BASOS ABS: 0 10*3/uL (ref 0.0–0.1)
Basophils Relative: 0 %
EOS ABS: 0 10*3/uL (ref 0.0–0.7)
EOS PCT: 0 %
HCT: 12.6 % — ABNORMAL LOW (ref 36.0–46.0)
Hemoglobin: 4.1 g/dL — CL (ref 12.0–15.0)
LYMPHS PCT: 22 %
Lymphs Abs: 4.1 10*3/uL — ABNORMAL HIGH (ref 0.7–4.0)
MCH: 33.1 pg (ref 26.0–34.0)
MCHC: 32.5 g/dL (ref 30.0–36.0)
MCV: 101.6 fL — AB (ref 78.0–100.0)
MONO ABS: 0.9 10*3/uL (ref 0.1–1.0)
Monocytes Relative: 5 %
Neutro Abs: 14 10*3/uL — ABNORMAL HIGH (ref 1.7–7.7)
Neutrophils Relative %: 73 %
Platelets: 455 10*3/uL — ABNORMAL HIGH (ref 150–400)
RBC: 1.24 MIL/uL — AB (ref 3.87–5.11)
RDW: 16.4 % — ABNORMAL HIGH (ref 11.5–15.5)
WBC: 19 10*3/uL — AB (ref 4.0–10.5)

## 2016-06-10 LAB — CBG MONITORING, ED
GLUCOSE-CAPILLARY: 186 mg/dL — AB (ref 65–99)
GLUCOSE-CAPILLARY: 129 mg/dL — AB (ref 65–99)
Glucose-Capillary: 151 mg/dL — ABNORMAL HIGH (ref 65–99)

## 2016-06-10 LAB — GLUCOSE, CAPILLARY: Glucose-Capillary: 86 mg/dL (ref 65–99)

## 2016-06-10 LAB — ABO/RH: ABO/RH(D): A POS

## 2016-06-10 LAB — SEDIMENTATION RATE: Sed Rate: 119 mm/hr — ABNORMAL HIGH (ref 0–22)

## 2016-06-10 LAB — CBC
HCT: 29 % — ABNORMAL LOW (ref 36.0–46.0)
Hemoglobin: 9.2 g/dL — ABNORMAL LOW (ref 12.0–15.0)
MCH: 32.5 pg (ref 26.0–34.0)
MCHC: 31.7 g/dL (ref 30.0–36.0)
MCV: 102.5 fL — ABNORMAL HIGH (ref 78.0–100.0)
Platelets: 264 10*3/uL (ref 150–400)
RBC: 2.83 MIL/uL — AB (ref 3.87–5.11)
RDW: 16.2 % — AB (ref 11.5–15.5)
WBC: 10.7 10*3/uL — AB (ref 4.0–10.5)

## 2016-06-10 LAB — PROCALCITONIN: Procalcitonin: 6.16 ng/mL

## 2016-06-10 LAB — RETICULOCYTES
RBC.: 2.81 MIL/uL — ABNORMAL LOW (ref 3.87–5.11)
Retic Count, Absolute: 39.3 10*3/uL (ref 19.0–186.0)
Retic Ct Pct: 1.4 % (ref 0.4–3.1)

## 2016-06-10 LAB — TROPONIN I
TROPONIN I: 1.24 ng/mL — AB (ref <0.03)
Troponin I: 1.31 ng/mL (ref <0.03)

## 2016-06-10 LAB — PROTIME-INR
INR: 1.16
Prothrombin Time: 14.9 seconds (ref 11.4–15.2)

## 2016-06-10 LAB — FOLATE: FOLATE: 25.5 ng/mL (ref >5.9)

## 2016-06-10 LAB — CK: CK TOTAL: 2236 U/L — AB (ref 38–234)

## 2016-06-10 LAB — I-STAT TROPONIN, ED: Troponin i, poc: 0.02 ng/mL (ref 0.00–0.08)

## 2016-06-10 LAB — IRON AND TIBC
IRON: 33 ug/dL (ref 28–170)
Saturation Ratios: 22 % (ref 10.4–31.8)
TIBC: 151 ug/dL — AB (ref 250–450)
UIBC: 118 ug/dL

## 2016-06-10 LAB — VALPROIC ACID LEVEL: Valproic Acid Lvl: 49 ug/mL — ABNORMAL LOW (ref 50.0–100.0)

## 2016-06-10 LAB — PHOSPHORUS: Phosphorus: 4.3 mg/dL (ref 2.5–4.6)

## 2016-06-10 LAB — MAGNESIUM: Magnesium: 2.4 mg/dL (ref 1.7–2.4)

## 2016-06-10 LAB — FERRITIN: Ferritin: 818 ng/mL — ABNORMAL HIGH (ref 11–307)

## 2016-06-10 LAB — APTT: APTT: 30 s (ref 24–36)

## 2016-06-10 LAB — VITAMIN B12: VITAMIN B 12: 6868 pg/mL — AB (ref 180–914)

## 2016-06-10 MED ORDER — SODIUM CHLORIDE 0.9% FLUSH
3.0000 mL | Freq: Two times a day (BID) | INTRAVENOUS | Status: DC
  Administered 2016-06-10 – 2016-06-16 (×4): 3 mL via INTRAVENOUS

## 2016-06-10 MED ORDER — PIPERACILLIN-TAZOBACTAM 3.375 G IVPB 30 MIN
3.3750 g | Freq: Once | INTRAVENOUS | Status: AC
  Administered 2016-06-10: 3.375 g via INTRAVENOUS
  Filled 2016-06-10: qty 50

## 2016-06-10 MED ORDER — SODIUM CHLORIDE 0.9 % IV BOLUS (SEPSIS)
500.0000 mL | Freq: Once | INTRAVENOUS | Status: AC
  Administered 2016-06-10: 500 mL via INTRAVENOUS

## 2016-06-10 MED ORDER — ACETAMINOPHEN 650 MG RE SUPP
650.0000 mg | Freq: Once | RECTAL | Status: AC
  Administered 2016-06-10: 650 mg via RECTAL
  Filled 2016-06-10: qty 1

## 2016-06-10 MED ORDER — VANCOMYCIN HCL IN DEXTROSE 1-5 GM/200ML-% IV SOLN
1000.0000 mg | Freq: Once | INTRAVENOUS | Status: AC
  Administered 2016-06-10: 1000 mg via INTRAVENOUS
  Filled 2016-06-10: qty 200

## 2016-06-10 MED ORDER — BISACODYL 10 MG RE SUPP
10.0000 mg | Freq: Every day | RECTAL | Status: DC | PRN
  Administered 2016-06-14: 10 mg via RECTAL
  Filled 2016-06-10: qty 1

## 2016-06-10 MED ORDER — DEXTROSE-NACL 5-0.45 % IV SOLN
INTRAVENOUS | Status: DC
  Administered 2016-06-11: 01:00:00 via INTRAVENOUS

## 2016-06-10 MED ORDER — ENOXAPARIN SODIUM 30 MG/0.3ML ~~LOC~~ SOLN
30.0000 mg | SUBCUTANEOUS | Status: DC
  Administered 2016-06-10 – 2016-06-15 (×6): 30 mg via SUBCUTANEOUS
  Filled 2016-06-10 (×6): qty 0.3

## 2016-06-10 MED ORDER — VANCOMYCIN HCL IN DEXTROSE 750-5 MG/150ML-% IV SOLN
750.0000 mg | INTRAVENOUS | Status: DC
  Administered 2016-06-12: 750 mg via INTRAVENOUS
  Filled 2016-06-10: qty 150

## 2016-06-10 MED ORDER — SODIUM CHLORIDE 0.9 % IV BOLUS (SEPSIS)
1000.0000 mL | Freq: Once | INTRAVENOUS | Status: AC
  Administered 2016-06-10: 1000 mL via INTRAVENOUS

## 2016-06-10 MED ORDER — PIPERACILLIN-TAZOBACTAM IN DEX 2-0.25 GM/50ML IV SOLN
2.2500 g | Freq: Four times a day (QID) | INTRAVENOUS | Status: DC
  Administered 2016-06-10 – 2016-06-12 (×8): 2.25 g via INTRAVENOUS
  Filled 2016-06-10 (×10): qty 50

## 2016-06-10 MED ORDER — INSULIN ASPART 100 UNIT/ML ~~LOC~~ SOLN
0.0000 [IU] | SUBCUTANEOUS | Status: DC
  Administered 2016-06-10: 2 [IU] via SUBCUTANEOUS
  Administered 2016-06-10 – 2016-06-11 (×2): 1 [IU] via SUBCUTANEOUS
  Administered 2016-06-11 – 2016-06-12 (×6): 2 [IU] via SUBCUTANEOUS
  Administered 2016-06-12 – 2016-06-13 (×3): 1 [IU] via SUBCUTANEOUS
  Administered 2016-06-13: 2 [IU] via SUBCUTANEOUS
  Administered 2016-06-13: 1 [IU] via SUBCUTANEOUS
  Administered 2016-06-13: 2 [IU] via SUBCUTANEOUS
  Administered 2016-06-14: 1 [IU] via SUBCUTANEOUS
  Administered 2016-06-14: 2 [IU] via SUBCUTANEOUS
  Administered 2016-06-14: 3 [IU] via SUBCUTANEOUS
  Administered 2016-06-14 – 2016-06-15 (×2): 2 [IU] via SUBCUTANEOUS
  Administered 2016-06-15 (×2): 1 [IU] via SUBCUTANEOUS
  Administered 2016-06-15: 2 [IU] via SUBCUTANEOUS
  Administered 2016-06-16: 1 [IU] via SUBCUTANEOUS
  Filled 2016-06-10: qty 1

## 2016-06-10 MED ORDER — FLEET ENEMA 7-19 GM/118ML RE ENEM: 1.0000 | ENEMA | Freq: Once | RECTAL | Status: DC | PRN

## 2016-06-10 MED ORDER — SODIUM CHLORIDE 0.9 % IV SOLN
Freq: Once | INTRAVENOUS | Status: AC
  Administered 2016-06-10: 13:00:00 via INTRAVENOUS

## 2016-06-10 MED ORDER — VALPROATE SODIUM 500 MG/5ML IV SOLN
500.0000 mg | Freq: Two times a day (BID) | INTRAVENOUS | Status: DC
  Administered 2016-06-10 – 2016-06-15 (×12): 500 mg via INTRAVENOUS
  Filled 2016-06-10 (×14): qty 5

## 2016-06-10 MED ORDER — ACETAMINOPHEN 325 MG PO TABS: 650.0000 mg | ORAL_TABLET | Freq: Four times a day (QID) | ORAL | Status: DC | PRN

## 2016-06-10 MED ORDER — ACETAMINOPHEN 650 MG RE SUPP: 650.0000 mg | Freq: Four times a day (QID) | RECTAL | Status: DC | PRN

## 2016-06-10 NOTE — ED Notes (Signed)
Pt's CBG result was 151. Informed Tresa EndoKelly - RN.

## 2016-06-10 NOTE — H&P (Signed)
History and Physical    Faith Wright ZES:[id number] DOB: 11-25-1933 DOA: 06/10/2016   PCP: Lesia Hausen, PA   Patient coming from/Resides with: [facility]  Admission status: Inpatient/telemetry -medically necessary to stay a minimum 2 midnights to rule out impending and/or unexpected changes in physiologic status that may differ from initial evaluation performed in the ER and/or at time of admission. Presents with PAF/hypotension in the field requiring urgent synchronized cardioversion with subsequent improvement in rhythm and vital signs. Presents with sepsis physiology although source not clear. Appears very dehydrated on clinical exam with acute kidney injury and mild transaminitis. Patient will require telemetry monitoring, IV fluids, empiric IV antibiotics until sepsis ruled out. Unclear baseline mentation therefore will be NPO until formal speech/swallowing evaluation completed. Also appears to be advanced stages of her dementia which is complicated by underlying schizophrenia and seizure disorder.  Palliative medicine consulted for goals of care.  Chief Complaint: Unresponsive state/sepsis physiology  HPI: Faith Wright is a 82 y.o. female with medical history significant for remote DVT, diabetes on metformin, dementia, seizure disorder, schizophrenia. Patient has had 3 ER visits since November. On 11/13 for generalized weakness, 12/19 for hip pain after fall, and 12/22 for fall with resultant knee pain. No significant injuries found on those evaluations and patient was sent back to the skilled nursing facility. Today she was apparently unresponsive at the nursing facility and upon EMS arrival patient had a heart rate of 250. She underwent emergency contrast cardioversion with 200 J with subsequent conversion to sinus rhythm/ sinus tachycardia. In the field and oral temperature was 103 with rectal temperature in the ER 101. Patient was awake and moving all extremities although was disoriented  which may be her baseline.  ED Course:  Vital Signs: BP 118/63   Pulse 99   Temp 97.2 F (36.2 C) (Oral)   Resp 13   Wt 52.2 kg (115 lb)   SpO2 94%  PCXR: No edema or consolidation CT head without contrast: Severe chronic small vessel changes and diffuse cerebral atrophy without acute intracranial abnormality Lab data: Sodium 150, potassium 4.4, BUN 73, creatinine 2.20, CO2 26, anion gap 17, glucose 194, albumin 2.5, AST 58,  poc troponin 0.00 and 0.02, iron 33, UIBC 118, TIBC 151, saturation 22, ferritin 818, folate 25.5, vitamin B12 6868, lactic acid 4.76 with repeat down to 0.98, initial white count 19,000 with hemoglobin 4.1 MCV 101.6 and platelets 455,000 with elevated neutrophils 14%. Repeat CBC after hydration WBCs 10,700, hemoglobin 9.2, MCV 102.5 and platelets 264,000, urinalysis abnormal with greater than 500 glucose, trace leukocyte, negative nitrite, specific gravity 0.21 WBCs (obtained after volume resuscitation); blood cultures and urine culture obtained in the ER, fecal occult blood was negative Medications and treatments: Normal saline boluses 1500 mL, vancomycin 1 g IV 1, Zosyn 3.375 g IV 1, Tylenol 650 mg PR 1  Review of Systems:  **Unable to aim from patient given altered mentation and unintelligible speech-history obtained from the chart   Past Medical History:  Diagnosis Date  . Dementia   . Diabetes mellitus without complication ([place])   . DVT (deep vein thrombosis) in pregnancy ([place])   . Hypertension   . Schizophrenia ([place])     History reviewed. No pertinent surgical history.  Social History   Social History  . Marital status: Single    Spouse name: N/A  . Number of children: N/A  . Years of education: N/A   Occupational History  . Not on file.   Social History  Main Topics  . Smoking status: Unknown If Ever Smoked  . Smokeless tobacco: Never Used  . Alcohol use No  . Drug use: No  . Sexual activity: Not on file   Other Topics Concern  . Not  on file   Social History Narrative  . No narrative on file    Mobility: Unknown Work history: Unknown   No Known Allergies  Unable to obtain family history given patient's altered mentation  Prior to Admission medications   Medication Sig Start Date End Date Taking? Authorizing Provider  acetaminophen (TYLENOL) 325 MG tablet Take 650 mg by mouth 2 (two) times daily as needed for mild pain, moderate pain, fever or headache.   Yes Historical Provider, MD  acetaminophen (TYLENOL) 500 MG tablet Take 1,000 mg by mouth 3 (three) times daily as needed for mild pain, moderate pain, fever or headache.   Yes Historical Provider, MD  aspirin EC 81 MG tablet Take 81 mg by mouth every morning.   Yes Historical Provider, MD  benazepril (LOTENSIN) 10 MG tablet Take 10 mg by mouth daily with breakfast.   Yes Historical Provider, MD  benzonatate (TESSALON) 100 MG capsule Take 100 mg by mouth 3 (three) times daily as needed for cough.   Yes Historical Provider, MD  citalopram (CELEXA) 10 MG tablet Take 10 mg by mouth daily with breakfast.   Yes Historical Provider, MD  divalproex (DEPAKOTE) 500 MG DR tablet Take 500 mg by mouth 2 (two) times daily.   Yes Historical Provider, MD  loratadine (CLARITIN) 10 MG tablet Take 10 mg by mouth daily as needed for allergies.   Yes Historical Provider, MD  Lurasidone HCl (LATUDA) 60 MG TABS Take 60 mg by mouth every morning.   Yes Historical Provider, MD  metFORMIN (GLUCOPHAGE) 500 MG tablet Take 500 mg by mouth daily with breakfast.    Yes Historical Provider, MD  polyvinyl alcohol-povidone (HYPOTEARS) 1.4-0.6 % ophthalmic solution Place 1 drop into both eyes 3 (three) times daily.    Yes Historical Provider, MD  traMADol (ULTRAM) 50 MG tablet Take 50 mg by mouth every 8 (eight) hours.   Yes Historical Provider, MD  vitamin B-12 (CYANOCOBALAMIN) 1000 MCG tablet Take 1,000 mcg by mouth every morning.   Yes Historical Provider, MD    Physical Exam: Vitals:    06/10/16 1145 06/10/16 1200 06/10/16 1215 06/10/16 1230  BP: 111/89 114/88 119/65 118/63  Pulse: 96 99    Resp: '20 17 18 13  ' Temp:      TempSrc:      SpO2: 100% 100% 100% 94%  Weight:          Constitutional: NAD, calm, comfortable Eyes: PERRL, lids and conjunctivae normal ENMT: Mucous membranes are extremely dry. Tongue with dry mucoid deposits. Posterior pharynx clear of any exudate or lesions.edentulous  Neck: normal, supple, no masses, no thyromegaly Respiratory: clear to auscultation bilaterally, no wheezing, no crackles. Normal respiratory effort. No accessory muscle use.  Cardiovascular: Regular rate and rhythm, no murmurs / rubs / gallops. No extremity edema. 2+ pedal pulses. No carotid bruits.  Abdomen: no tenderness, no masses palpated. No hepatosplenomegaly. Bowel sounds positive. Rectum with large fecal ball noted extending out of rectum on inspection. Musculoskeletal: no clubbing / cyanosis. No joint deformity upper and lower extremities. Good ROM, no contractures. Normal muscle tone.  Skin: no rashes, lesions, ulcers. No induration Neurologic: CN 2-12 grossly intact. Sensation intact, DTR normal. Strength unable to be tested accurately but patient is moving all  4 extremities spontaneously although keeps hips flexed. Psychiatric: Awake, speech unintelligible   Labs on Admission: I have personally reviewed following labs and imaging studies  CBC:  Recent Labs Lab 06/04/16 2054 06/10/16 0900 06/10/16 1014  WBC 9.0 19.0* 10.7*  NEUTROABS 6.6 14.0*  --   HGB 9.5* 4.1* 9.2*  HCT 28.5* 12.6* 29.0*  MCV 96.0 101.6* 102.5*  PLT 243 455* 546   Basic Metabolic Panel:  Recent Labs Lab 06/04/16 2054 06/10/16 0900  NA 143 150*  K 3.8 4.4  CL 105 107  CO2 30 26  GLUCOSE 157* 194*  BUN 27* 73*  CREATININE 1.00 2.20*  CALCIUM 9.0 9.9   GFR: CrCl cannot be calculated (Unknown ideal weight.). Liver Function Tests:  Recent Labs Lab 06/10/16 0900  AST 58*  ALT  25  ALKPHOS 53  BILITOT 0.9  PROT 7.7  ALBUMIN 2.5*   No results for input(s): LIPASE, AMYLASE in the last 168 hours. No results for input(s): AMMONIA in the last 168 hours. Coagulation Profile: No results for input(s): INR, PROTIME in the last 168 hours. Cardiac Enzymes: No results for input(s): CKTOTAL, CKMB, CKMBINDEX, TROPONINI in the last 168 hours. BNP (last 3 results) No results for input(s): PROBNP in the last 8760 hours. HbA1C: No results for input(s): HGBA1C in the last 72 hours. CBG: No results for input(s): GLUCAP in the last 168 hours. Lipid Profile: No results for input(s): CHOL, HDL, LDLCALC, TRIG, CHOLHDL, LDLDIRECT in the last 72 hours. Thyroid Function Tests: No results for input(s): TSH, T4TOTAL, FREET4, T3FREE, THYROIDAB in the last 72 hours. Anemia Panel:  Recent Labs  06/10/16 1014  VITAMINB12 6,868*  FOLATE 25.5  FERRITIN 818*  TIBC 151*  IRON 33  RETICCTPCT 1.4   Urine analysis:    Component Value Date/Time   COLORURINE AMBER (A) 06/10/2016 1110   APPEARANCEUR CLEAR 06/10/2016 1110   LABSPEC 1.021 06/10/2016 1110   PHURINE 5.0 06/10/2016 1110   GLUCOSEU >=500 (A) 06/10/2016 1110   HGBUR NEGATIVE 06/10/2016 1110   BILIRUBINUR NEGATIVE 06/10/2016 1110   KETONESUR NEGATIVE 06/10/2016 1110   PROTEINUR NEGATIVE 06/10/2016 1110   NITRITE NEGATIVE 06/10/2016 1110   LEUKOCYTESUR TRACE (A) 06/10/2016 1110   Sepsis Labs: '@LABRCNTIP' (procalcitonin:4,lacticidven:4) ) Recent Results (from the past 240 hour(s))  Blood Culture (routine x 2)     Status: None (Preliminary result)   Collection Time: 06/10/16  9:19 AM  Result Value Ref Range Status   Specimen Description BLOOD RIGHT WRIST  Final   Special Requests BOTTLES DRAWN AEROBIC ONLY  3CC  Final   Culture PENDING  Incomplete   Report Status PENDING  Incomplete     Radiological Exams on Admission: Ct Head Wo Contrast  Result Date: 06/10/2016 CLINICAL DATA:  Code sepsis EXAM: CT HEAD WITHOUT  CONTRAST TECHNIQUE: Contiguous axial images were obtained from the base of the skull through the vertex without intravenous contrast. COMPARISON:  04/26/2016 FINDINGS: Brain: Severe chronic small vessel disease changes throughout the deep white matter. Diffuse cerebral atrophy. Physiologic calcifications in the basal ganglia bilaterally. No acute intracranial abnormality. Specifically, no hemorrhage, hydrocephalus, mass lesion, acute infarction, or significant intracranial injury. Vascular: No hyperdense vessel or unexpected calcification. Skull: No acute calvarial abnormality. Sinuses/Orbits: Visualized paranasal sinuses and mastoids clear. Orbital soft tissues unremarkable. Other: None IMPRESSION: Severe chronic small vessel disease changes and diffuse cerebral atrophy. No acute intracranial abnormality. Electronically Signed   By: Rolm Baptise M.D.   On: 06/10/2016 10:03   Dg Chest Port 1  View  Result Date: 06/10/2016 CLINICAL DATA:  Fever and altered mental status EXAM: PORTABLE CHEST 1 VIEW COMPARISON:  April 16, 2016 FINDINGS: There is a probable nipple shadow at the right base. There is no edema or consolidation. Heart is upper normal in size with pulmonary vascularity within normal limits. No adenopathy. There is atherosclerotic calcification in the aorta. No bone lesions. IMPRESSION: Suspect nipple shadow on the right; repeat study with nipple markers could be helpful to confirm. No edema or consolidation. Stable cardiac silhouette. There is aortic atherosclerosis. Electronically Signed   By: Lowella Grip III M.D.   On: 06/10/2016 09:13    EKG: (Independently reviewed) sinus tachycardia with ventricular rate 107 bpm, QTC 443 ms, voltage criteria met for LVH, diffuse ST segment abnormalities as notable downsloping ST segment in lead 1 aVL with ST segment elevation in aVR and V1 and V2 V3 and V4, subtle ST segment depression and leads to V5 and V6-ST. The elevation in aVR, V1, V2, V3 and V4 are  new and more pronounced when compared with previous EKGs from 11/13 and 12/22 with loss of R wave size and abnormal r wave progression  Assessment/Plan Principal Problem:   Sepsis  -Patient presents with sepsis physiology as follows: Temperature 103, tachycardia with initial heart rate 250 bpm in the field, initial white count greater than 12,000, altered mentation, acute kidney injury, acute transaminitis, and elevated serum lactate greater than 4 -Source of infection unknown so we'll utilize empiric broad-spectrum antibiotics with Zosyn and vancomycin as recommended by protocol -Sepsis physiology could be related to profound dehydration with notable rapid improvement in lactate with reversal of severe tachycardia and dehydration -Follow up on cultures -As noted lactate has decreased from greater than 4 to 0.98, she is now normotensive and alert although speech is unintelligible (? Baseline) -Repeat chest x-ray in a.m. after hydration -Respiratory viral panel -ESR and Procalcitonin  Active Problems:   Acute kidney injury  -Baseline renal function: 20/1.21  -Current renal function:  73/2.20 -Suspect multifactorial etiology: Dehydration and utilization of metformin prior to admission; possible sepsis -Hold offending medications -Follow electrolytes    PAF (paroxysmal atrial fibrillation)  -Review of EMS telemetry strips reveal rhythm more consistent with PAF -Currently maintaining sinus rhythm/sinus tachycardia after urgent synchronous cardioversion in the field -chadvasc=4 but patient appears to be an extremely poor candidate for anticoagulation given advanced age, underlying dementia/schizophrenia and documentation of several falls in the past 6 weeks -Patient also has nonspecific ST segment elevation on post cardioversion EKG with initial poc troponin 2 negative but for completeness of exam will cycle troponin and obtain echocardiogram -Suspect above tachyarrhythmia secondary to  appropriate physiologic response to severe dehydration    Dehydration with hypernatremia -I suspect patient has underlying failure to thrive with dehydration made worse by home medications including metformin, psychotropic medications and antiepileptic drugs -Continue IV fluids and follow electrolytes    Seizure disorder  -Patient found unresponsive and hypotensive and presumed related to hemodynamic instability -Difficult to determine if patient was post ictal -Check CK -Continue Depakote but use IV route -Depakote level    Diabetes mellitus type 2 in nonobese  -Hold metformin in setting of acute kidney injury with elevated anion gap -Follow CBGs -SSI -HgbA1c    Transaminitis -Likely related to low perfusion from hypotension and tachycardia; sepsis -Repeat transaminases in a.m.    Dementia/Schizophrenia -Unclear baseline mental status -Patient awake but with unintelligible speech -Accompanied by DO NOT RESUSCITATE form -Appears to have significant failure to thrive  based on recent recurrent ER visits therefore I have asked palliative medicine to assist with goals of care/completing MOST form. Palliative medicine has made initial contact and have discovered contact information for a niece and a son with messages left on voice mail by palliative RN -Formal swallowing evaluation per speech therapy    Constipation -Patient has had 1 bowel movement since arrival and upon my evaluation was found to have a large firm stool ball lodged in the rectum -Portable abdomen to determine degree of constipation -prn Dulcolax suppository and/or Fleet's enema      DVT prophylaxis: Lovenox dose adjusted for decreased creatinine clearance Code Status: DO NOT RESUSCITATE Family Communication: Not applicable Disposition Plan: Anticipate discharge back to skilled nursing facility when medically stable Consults called: Daily at of medicine    Taisa Deloria L. ANP-BC [facility] Pager  [phone]   If 7PM-7AM, please contact night-coverage [email or web address] Password [email or web address]  06/10/2016, 1:56 PM

## 2016-06-10 NOTE — ED Notes (Signed)
Pt's CBG result was 186. Informed [place] - RN.

## 2016-06-10 NOTE — Progress Notes (Signed)
CRITICAL VALUE ALERT  Critical value received:  Troponin 1.2,   Date of notification:  06/10/2016  Time of notification:  2056  Critical value read back:Yes.    Nurse who received alert:  DominicaAsia Anihya Tuma  MD notified (1st page):  Schorr, NP   Time of first page:  2056  Responding MD:  Clearence PedSchorr, NP Fluids ordered

## 2016-06-10 NOTE — ED Notes (Signed)
MD made aware of patient repeat CBC hemoglobin of 9.2, discontinuing transfusion orders.

## 2016-06-10 NOTE — ED Provider Notes (Signed)
[facility]-EM[facility]Y DEPT Provider Note   CSN: [id number] Arrival date & time: 06/10/16  [id number]     History   Chief Complaint Chief Complaint  Patient presents with  . Altered Mental Status    HPI Faith Wright is a 82 y.o. female.  The history is provided by the EMS personnel.  Altered Mental Status   This is a new problem. Episode onset: 1 wk ago. The problem has been gradually worsening. Associated symptoms include confusion, somnolence and weakness (L sided). Her past medical history is significant for diabetes, hypertension and dementia. Her past medical history does not include seizures, CVA, COPD or head trauma (was seen for fall 6d ago but neg head CT at that time).  -EMS was called out to the nursing home for possible stroke due to altered mental status and the patient on looking to the left. Patient was found to be in A. fib RVR with heart rate above 200. EMS cardioverted the patient with 200 J into sinus rhythm. Hypertension improved from 50s systolic to above 100 systolic. Patient's glucose was 170s. IV access was not able to be obtained prior to arrival. Patient also found to have a temperature of 103. Progressive facility dimension to EMS she has a history of UTIs. She had a negative urine dipstick a few days ago. Patient has had progressive altered mental status and decreased level of consciousness over the last week. Patient was seen 6 days ago after sustaining a fall and had a negative head CT at that time.  Past Medical History:  Diagnosis Date  . Dementia   . Diabetes mellitus without complication (HCC)   . DVT (deep vein thrombosis) in pregnancy (HCC)   . Hypertension   . Schizophrenia [facility](HCC)     Patient Active Problem List   Diagnosis Date Noted  . Sepsis (HCC) 06/10/2016  . Dehydration 06/10/2016  . Seizure disorder (HCC) 06/10/2016  . Schizophrenia (HCC) 06/10/2016  . Diabetes mellitus type 2 in nonobese (HCC) 06/10/2016  . Acute kidney injury (HCC) 06/10/2016  .  SVT (supraventricular tachycardia) (HCC) 06/10/2016    History reviewed. No pertinent surgical history.  OB History    No data available       Home Medications    Prior to Admission medications   Medication Sig Start Date End Date Taking? Authorizing Provider  acetaminophen (TYLENOL) 325 MG tablet Take 650 mg by mouth 2 (two) times daily as needed for mild pain, moderate pain, fever or headache.   Yes Historical Provider, MD  acetaminophen (TYLENOL) 500 MG tablet Take 1,000 mg by mouth 3 (three) times daily as needed for mild pain, moderate pain, fever or headache.   Yes Historical Provider, MD  aspirin EC 81 MG tablet Take 81 mg by mouth every morning.   Yes Historical Provider, MD  benazepril (LOTENSIN) 10 MG tablet Take 10 mg by mouth daily with breakfast.   Yes Historical Provider, MD  benzonatate (TESSALON) 100 MG capsule Take 100 mg by mouth 3 (three) times daily as needed for cough.   Yes Historical Provider, MD  citalopram (CELEXA) 10 MG tablet Take 10 mg by mouth daily with breakfast.   Yes Historical Provider, MD  divalproex (DEPAKOTE) 500 MG DR tablet Take 500 mg by mouth 2 (two) times daily.   Yes Historical Provider, MD  loratadine (CLARITIN) 10 MG tablet Take 10 mg by mouth daily as needed for allergies.   Yes Historical Provider, MD  Lurasidone HCl (LATUDA) 60 MG TABS Take 60 mg  by mouth every morning.   Yes Historical Provider, MD  metFORMIN (GLUCOPHAGE) 500 MG tablet Take 500 mg by mouth daily with breakfast.    Yes Historical Provider, MD  polyvinyl alcohol-povidone (HYPOTEARS) 1.4-0.6 % ophthalmic solution Place 1 drop into both eyes 3 (three) times daily.    Yes Historical Provider, MD  traMADol (ULTRAM) 50 MG tablet Take 50 mg by mouth every 8 (eight) hours.   Yes Historical Provider, MD  vitamin B-12 (CYANOCOBALAMIN) 1000 MCG tablet Take 1,000 mcg by mouth every morning.   Yes Historical Provider, MD    Family History History reviewed. No pertinent family  history.  Social History Social History  Substance Use Topics  . Smoking status: Unknown If Ever Smoked  . Smokeless tobacco: Never Used  . Alcohol use No     Allergies   Patient has no known allergies.   Review of Systems Review of Systems  Unable to perform ROS: Mental status change  Constitutional: Positive for fever.  Eyes: Negative for visual disturbance.  Respiratory: Negative for shortness of breath.   Cardiovascular: Negative for chest pain (none reported).       Has in afib RVR PTA  Gastrointestinal: Negative for abdominal pain, diarrhea and vomiting.  Skin: Negative for color change and rash.  Neurological: Positive for weakness (L sided).  Psychiatric/Behavioral: Positive for confusion.  All other systems reviewed and are negative.    Physical Exam Updated Vital Signs BP 118/63   Pulse 99   Temp 97.2 F (36.2 C) (Oral)   Resp 13   Wt 52.2 kg   SpO2 94%   Physical Exam  Constitutional: She appears well-developed and well-nourished. She appears listless. No distress.  HENT:  Head: Normocephalic and atraumatic.  Oral thrush, dry MM  Eyes: Conjunctivae are normal. Pupils are equal, round, and reactive to light.  Small pupils b/l. Able to look L and R on exam  Neck: Neck supple.  Cardiovascular: Regular rhythm, normal heart sounds and intact distal pulses.  Tachycardia present.   No murmur heard. Pulmonary/Chest: Effort normal and breath sounds normal. No respiratory distress. She has no wheezes. She has no rales.  Abdominal: Soft. She exhibits no distension. There is no tenderness. There is no guarding.  Musculoskeletal: She exhibits no edema.  Neurological: She appears listless. She is disoriented. GCS eye subscore is 4. GCS verbal subscore is 2. GCS motor subscore is 6.  Only able to squeeze R hand to command, unreliable neuro exam. Not withdrawing to pain in b/l LE   1/5 LUE, 0/5 RUE, LLE, RLE    Skin: Skin is warm. She is diaphoretic.   Psychiatric: She has a normal mood and affect.  Nursing note and vitals reviewed.    ED Treatments / Results  Labs (all labs ordered are listed, but only abnormal results are displayed) Labs Reviewed  COMPREHENSIVE METABOLIC PANEL - Abnormal; Notable for the following:       Result Value   Sodium 150 (*)    Glucose, Bld 194 (*)    BUN 73 (*)    Creatinine, Ser 2.20 (*)    Albumin 2.5 (*)    AST 58 (*)    GFR calc non Af Amer 20 (*)    GFR calc Af Amer 23 (*)    Anion gap 17 (*)    All other components within normal limits  CBC WITH DIFFERENTIAL/PLATELET - Abnormal; Notable for the following:    WBC 19.0 (*)    RBC 1.24 (*)  Hemoglobin 4.1 (*)    HCT 12.6 (*)    MCV 101.6 (*)    RDW 16.4 (*)    Platelets 455 (*)    Neutro Abs 14.0 (*)    Lymphs Abs 4.1 (*)    All other components within normal limits  URINALYSIS, ROUTINE W REFLEX MICROSCOPIC - Abnormal; Notable for the following:    Color, Urine AMBER (*)    Glucose, UA >=500 (*)    Leukocytes, UA TRACE (*)    Squamous Epithelial / LPF 0-5 (*)    Non Squamous Epithelial 0-5 (*)    All other components within normal limits  VITAMIN B12 - Abnormal; Notable for the following:    Vitamin B-12 6,868 (*)    All other components within normal limits  IRON AND TIBC - Abnormal; Notable for the following:    TIBC 151 (*)    All other components within normal limits  FERRITIN - Abnormal; Notable for the following:    Ferritin 818 (*)    All other components within normal limits  RETICULOCYTES - Abnormal; Notable for the following:    RBC. 2.81 (*)    All other components within normal limits  CBC - Abnormal; Notable for the following:    WBC 10.7 (*)    RBC 2.83 (*)    Hemoglobin 9.2 (*)    HCT 29.0 (*)    MCV 102.5 (*)    RDW 16.2 (*)    All other components within normal limits  I-STAT CG4 LACTIC ACID, ED - Abnormal; Notable for the following:    Lactic Acid, Venous 4.76 (*)    All other components within normal  limits  CULTURE, BLOOD (ROUTINE X 2)  CULTURE, BLOOD (ROUTINE X 2)  URINE CULTURE  RESPIRATORY PANEL BY PCR  FOLATE  CK  SEDIMENTATION RATE  PROCALCITONIN  PROTIME-INR  APTT  VALPROIC ACID LEVEL  HEMOGLOBIN A1C  I-STAT TROPOININ, ED  POC OCCULT BLOOD, ED  I-STAT CG4 LACTIC ACID, ED  TYPE AND SCREEN  ABO/RH    EKG  EKG Interpretation None       Radiology Ct Head Wo Contrast  Result Date: 06/10/2016 CLINICAL DATA:  Code sepsis EXAM: CT HEAD WITHOUT CONTRAST TECHNIQUE: Contiguous axial images were obtained from the base of the skull through the vertex without intravenous contrast. COMPARISON:  04/26/2016 FINDINGS: Brain: Severe chronic small vessel disease changes throughout the deep white matter. Diffuse cerebral atrophy. Physiologic calcifications in the basal ganglia bilaterally. No acute intracranial abnormality. Specifically, no hemorrhage, hydrocephalus, mass lesion, acute infarction, or significant intracranial injury. Vascular: No hyperdense vessel or unexpected calcification. Skull: No acute calvarial abnormality. Sinuses/Orbits: Visualized paranasal sinuses and mastoids clear. Orbital soft tissues unremarkable. Other: None IMPRESSION: Severe chronic small vessel disease changes and diffuse cerebral atrophy. No acute intracranial abnormality. Electronically Signed   By: Charlett Nose M.D.   On: 06/10/2016 10:03   Dg Chest Port 1 View  Result Date: 06/10/2016 CLINICAL DATA:  Fever and altered mental status EXAM: PORTABLE CHEST 1 VIEW COMPARISON:  April 16, 2016 FINDINGS: There is a probable nipple shadow at the right base. There is no edema or consolidation. Heart is upper normal in size with pulmonary vascularity within normal limits. No adenopathy. There is atherosclerotic calcification in the aorta. No bone lesions. IMPRESSION: Suspect nipple shadow on the right; repeat study with nipple markers could be helpful to confirm. No edema or consolidation. Stable cardiac  silhouette. There is aortic atherosclerosis. Electronically Signed   By:  Bretta Bang III M.D.   On: 06/10/2016 09:13    Procedures Procedures (including critical care time)  Medications Ordered in ED Medications  valproate (DEPACON) 500 mg in dextrose 5 % 50 mL IVPB (not administered)  insulin aspart (novoLOG) injection 0-9 Units (not administered)  sodium chloride 0.9 % bolus 1,000 mL (0 mLs Intravenous Stopped 06/10/16 1016)    And  sodium chloride 0.9 % bolus 500 mL (0 mLs Intravenous Stopped 06/10/16 0936)  vancomycin (VANCOCIN) IVPB 1000 mg/200 mL premix (0 mg Intravenous Stopped 06/10/16 1034)  piperacillin-tazobactam (ZOSYN) IVPB 3.375 g (0 g Intravenous Stopped 06/10/16 0951)  acetaminophen (TYLENOL) suppository 650 mg (650 mg Rectal Given 06/10/16 0921)  0.9 %  sodium chloride infusion ( Intravenous New Bag/Given 06/10/16 1310)     Initial Impression / Assessment and Plan / ED Course  I have reviewed the triage vital signs and the nursing notes.  Pertinent labs & imaging results that were available during my care of the patient were reviewed by me and considered in my medical decision making (see chart for details).  Clinical Course    Patient is a 82 year old female presenting from nursing home with altered mental status. Next  Patient was found to be in A. fib RVR with heart rate above 200 prior to arrival. Patient was cardioverted with 200 J back into sinus rhythm prior to arrival. Heart rate 110s after cardioversion. Patient is mildly hyperglycemic 170s per EMS. Blood pressures improved after cardioversion from 50 systolic to over 161 systolic. Patient also found be febrile 103. Patient has no history of atrial fibrillation but does have history of UTIs. Patient has dementia however is able to talk and will herself around the wheelchair at baseline.  On exam patient appears listless and dehydrated. Patient not following commands except for left hand. Patient is able  to look left and right. Patient is not moving her right upper extremity or bilateral lower extremities. Heart rate 110s here and hypotensive 80/50 initially. Temp of 101 here. Concern for sepsis. Concern for possible stroke as well given poor mental status and not moving all 4 extremities.   Patient found to have lactate of 4.7. Patient given 30 mL/KG for severe sepsis and hypotension. Hemoglobin 4.1 down from 9, 1 week ago. We'll check CBC again to confirm hemoglobin.  On reevaluation patient's blood pressure has improved to below the 100 systolic and is spontaneously moving all 4 extremities with improved mentation. Patient still not able to verbalize but does track provider in room and following commands moving all 4 ext.  -Hgb actually 9 on recheck. Blood transfusion orders cancelled.  -Will check repeat lactic acid, RVP/Flu since urine and CXR neg. -Plan to admit to hospitalist for severe sepsis/septic shock, altered mental status, AKI.  -Repeat lactic acid normalized to 0.9. Mentation significant improved and able to answer questions. Tachycardia has improved and blood pressure continues to normalize.  Pt seen with attending Dr. Fayrene Fearing.    Final Clinical Impressions(s) / ED Diagnoses   Final diagnoses:  AKI (acute kidney injury) (HCC)  Septic shock (HCC)  Fever, unspecified fever cause  Altered mental status, unspecified altered mental status type    New Prescriptions New Prescriptions   No medications on file     Dwana Melena, DO 06/10/16 1330    Rolland Porter, MD 06/18/16 [phone]

## 2016-06-10 NOTE — ED Notes (Signed)
Lab called about CBC result. Lab had not ran CBC. Lab to run CBC now.

## 2016-06-10 NOTE — Progress Notes (Signed)
Pharmacy Antibiotic Note  Faith PewMarie Wright is a 82 y.o. female admitted on 06/10/2016 with sepsis.  Pharmacy has been consulted for vancomycin and zosyn dosing.  Patient presented with altered mental status and tachycardia. Her serum creatinine is elevated above baseline, currently at 2.2, with an estimated CrCL ~16 mL/min. She received a dose of zosyn 3.375g and vancomycin 1000mg  IV x1 in the ED. Vancomycin load appropriate for her weight.   Plan: Vancomycin 750mg  IV every 48 hours Zosyn 2.25gm IV every 6 hours Monitor renal function, clinical progress, and vancomycin trough as indicated    Weight: 115 lb (52.2 kg)  Temp (24hrs), Avg:99.1 F (37.3 C), Min:97.2 F (36.2 C), Max:101 F (38.3 C)   Recent Labs Lab 06/04/16 2054 06/10/16 0900 06/10/16 0914 06/10/16 1014 06/10/16 1322  WBC 9.0 19.0*  --  10.7*  --   CREATININE 1.00 2.20*  --   --   --   LATICACIDVEN  --   --  4.76*  --  0.98    CrCl cannot be calculated (Unknown ideal weight.).    No Known Allergies  Antimicrobials this admission: 12/28 zosyn >>  12/28 vanc >>   Dose adjustments this admission:   Microbiology results: 12/28 BCx: pending 12/28 UCx: sent   Thank you for allowing pharmacy to be a part of this patient's care.  Carylon PerchesMaggie Shuda, PharmD Acute Care Pharmacy Resident  Pager: [phone] 06/10/2016

## 2016-06-10 NOTE — ED Provider Notes (Signed)
Patient seen and evaluated. Discussed with Dr. Dwana Melenaobin Dong.  Pt presents moving only LUE to command.  After IVF, Tylenol, O2 pt moves all four.  Initial Hb 4, but repeat 9.1.  Given antibiotics.  Agree with assessment and dispo per Dr. Roger Shelterong. asait repeat Lactate.  Sepsis - Repeat Assessment  Performed at:     : Vitals     Blood pressure 118/63, pulse 99, temperature 97.2 F (36.2 C), temperature source Oral, resp. rate 13, weight 115 lb (52.2 kg), SpO2 94 %.  Heart:     Regular rate and rhythm  Lungs:    CTA  Capillary Refill:   <2 sec  Peripheral Pulse:   Radial pulse palpable  Skin:     Normal Color   Moving all four extremities.     Rolland PorterMark Ezel Vallone, MD 06/10/16 1242

## 2016-06-10 NOTE — Progress Notes (Addendum)
Palliative Medicine RN Note: Consult rec'd with note that there are no contacts listed. Note from 04/26/16 ED visit provided contact information for Niece Faith Wright [phone]. I called her, and she said pt has both a son Faith Wright [phone]) and a county [place] [phone]--will need her SS#). I [place] a message w [place]; [place] call with 24 hours; however, today is Thursday afternoon, and Monday is a holiday, so a delay in the return call would not be unexpected.   PMT will proceed once we both have an available provider and discern appropriate decision-maker.  Faith ChanceMelanie G. Freyja Govea, RN, BSN, [facility] 06/10/2016 2:09 PM Cell [phone] 8:00-4:00 Monday-Friday Office [phone]  1444 [place] signed all her own paperwork for benefits, and there is not a court-appointed guardian. She states we should move through the list of surrogates as normal. Called her son Mariana KaufmanMarvin; [facility] and walking in to see the pt. Now that surrogate is decided, PMT will get involved as soon as we have an available provider.  Faith ChanceMelanie G. Jesse Hirst, RN, BSN, [facility] 06/10/2016 2:47 PM Cell [phone] 8:00-4:00 Monday-Friday Office [phone]

## 2016-06-10 NOTE — ED Notes (Signed)
Pt more alert at this time, able to respond more appropriately, giving one word answers where as on arrival was non-verbal. Oral temp down to 97.2, HR 100.

## 2016-06-10 NOTE — ED Triage Notes (Signed)
[facility] for evaluation of worsening altered mental status over the last week. Upon EMS arrival, pt was unresponsive with HR of 250, SVT, BP 70/40 - was cardioverted with 200 J, on arrival rhythm at 110 sinus tach. Pt is following some commands but is nonverbal. EMS temperature read at 103 oral, at present time rectal temperature 101. MD Fayrene FearingJames at bedside.

## 2016-06-11 ENCOUNTER — Ambulatory Visit (HOSPITAL_COMMUNITY): Payer: Medicare Other

## 2016-06-11 ENCOUNTER — Inpatient Hospital Stay (HOSPITAL_COMMUNITY): Payer: Medicare Other

## 2016-06-11 DIAGNOSIS — R9431 Abnormal electrocardiogram [ECG] [EKG]: Secondary | ICD-10-CM

## 2016-06-11 DIAGNOSIS — I480 Paroxysmal atrial fibrillation: Secondary | ICD-10-CM

## 2016-06-11 DIAGNOSIS — E43 Unspecified severe protein-calorie malnutrition: Secondary | ICD-10-CM | POA: Insufficient documentation

## 2016-06-11 DIAGNOSIS — E870 Hyperosmolality and hypernatremia: Secondary | ICD-10-CM

## 2016-06-11 DIAGNOSIS — K5900 Constipation, unspecified: Secondary | ICD-10-CM

## 2016-06-11 LAB — COMPREHENSIVE METABOLIC PANEL
ALK PHOS: 40 U/L (ref 38–126)
ALT: 23 U/L (ref 14–54)
ANION GAP: 11 (ref 5–15)
AST: 59 U/L — AB (ref 15–41)
Albumin: 1.9 g/dL — ABNORMAL LOW (ref 3.5–5.0)
BILIRUBIN TOTAL: 0.7 mg/dL (ref 0.3–1.2)
BUN: 69 mg/dL — AB (ref 6–20)
CALCIUM: 8.5 mg/dL — AB (ref 8.9–10.3)
CO2: 24 mmol/L (ref 22–32)
Chloride: 115 mmol/L — ABNORMAL HIGH (ref 101–111)
Creatinine, Ser: 1.81 mg/dL — ABNORMAL HIGH (ref 0.44–1.00)
GFR calc Af Amer: 29 mL/min — ABNORMAL LOW (ref >60)
GFR calc non Af Amer: 25 mL/min — ABNORMAL LOW (ref >60)
GLUCOSE: 142 mg/dL — AB (ref 65–99)
Potassium: 3.5 mmol/L (ref 3.5–5.1)
SODIUM: 150 mmol/L — AB (ref 135–145)
Total Protein: 5.4 g/dL — ABNORMAL LOW (ref 6.5–8.1)

## 2016-06-11 LAB — TROPONIN I: Troponin I: 1.17 ng/mL (ref <0.03)

## 2016-06-11 LAB — RESPIRATORY PANEL BY PCR
ADENOVIRUS-RVPPCR: NOT DETECTED
BORDETELLA PERTUSSIS-RVPCR: NOT DETECTED
CHLAMYDOPHILA PNEUMONIAE-RVPPCR: NOT DETECTED
CORONAVIRUS HKU1-RVPPCR: NOT DETECTED
CORONAVIRUS NL63-RVPPCR: NOT DETECTED
Coronavirus 229E: NOT DETECTED
Coronavirus OC43: NOT DETECTED
Influenza A: NOT DETECTED
Influenza B: NOT DETECTED
MYCOPLASMA PNEUMONIAE-RVPPCR: NOT DETECTED
Metapneumovirus: NOT DETECTED
Parainfluenza Virus 1: NOT DETECTED
Parainfluenza Virus 2: NOT DETECTED
Parainfluenza Virus 3: NOT DETECTED
Parainfluenza Virus 4: NOT DETECTED
Respiratory Syncytial Virus: NOT DETECTED
Rhinovirus / Enterovirus: NOT DETECTED

## 2016-06-11 LAB — ECHOCARDIOGRAM COMPLETE: WEIGHTICAEL: 1816 [oz_av]

## 2016-06-11 LAB — CBC
HCT: 27.1 % — ABNORMAL LOW (ref 36.0–46.0)
HEMOGLOBIN: 8.7 g/dL — AB (ref 12.0–15.0)
MCH: 32.8 pg (ref 26.0–34.0)
MCHC: 32.1 g/dL (ref 30.0–36.0)
MCV: 102.3 fL — ABNORMAL HIGH (ref 78.0–100.0)
Platelets: 250 10*3/uL (ref 150–400)
RBC: 2.65 MIL/uL — ABNORMAL LOW (ref 3.87–5.11)
RDW: 16.1 % — AB (ref 11.5–15.5)
WBC: 10.6 10*3/uL — ABNORMAL HIGH (ref 4.0–10.5)

## 2016-06-11 LAB — MRSA PCR SCREENING: MRSA by PCR: NEGATIVE

## 2016-06-11 LAB — GLUCOSE, CAPILLARY
GLUCOSE-CAPILLARY: 100 mg/dL — AB (ref 65–99)
GLUCOSE-CAPILLARY: 111 mg/dL — AB (ref 65–99)
GLUCOSE-CAPILLARY: 152 mg/dL — AB (ref 65–99)
Glucose-Capillary: 109 mg/dL — ABNORMAL HIGH (ref 65–99)
Glucose-Capillary: 136 mg/dL — ABNORMAL HIGH (ref 65–99)
Glucose-Capillary: 151 mg/dL — ABNORMAL HIGH (ref 65–99)
Glucose-Capillary: 158 mg/dL — ABNORMAL HIGH (ref 65–99)

## 2016-06-11 LAB — HEMOGLOBIN A1C
HEMOGLOBIN A1C: 6.2 % — AB (ref 4.8–5.6)
MEAN PLASMA GLUCOSE: 131 mg/dL

## 2016-06-11 LAB — URINE CULTURE: CULTURE: NO GROWTH

## 2016-06-11 MED ORDER — ORAL CARE MOUTH RINSE
15.0000 mL | Freq: Two times a day (BID) | OROMUCOSAL | Status: DC
  Administered 2016-06-11 – 2016-06-16 (×5): 15 mL via OROMUCOSAL

## 2016-06-11 MED ORDER — CHLORHEXIDINE GLUCONATE 0.12 % MT SOLN
15.0000 mL | Freq: Two times a day (BID) | OROMUCOSAL | Status: DC
  Administered 2016-06-11 – 2016-06-16 (×10): 15 mL via OROMUCOSAL
  Filled 2016-06-11 (×9): qty 15

## 2016-06-11 NOTE — Progress Notes (Addendum)
PROGRESS NOTE  Faith Wright  PYP:[id number] DOB: [birth year]  DOA: 06/10/2016 PCP: Lesia Hausen, PA   Brief Narrative:  82 year old female with PMH of remote DVT, DM 2, dementia, seizure disorder, schizophrenia, 3 ER visits since November (11/13: Generalized weakness, 12/19: Hip pain after fall, 12/22: Knee pain after fall), no significant injuries found on these evaluations and patient was returned to SNF, presented to [facility] ED on 06/10/16 after she was apparently found unresponsive at the SNF and upon EMS arrival heart rate of 250, underwent emergent cardioversion with 200 J with subsequent conversion to sinus rhythm/sinus tachycardia and was noted to be febrile 103F. Admitted for sepsis of unclear etiology, acute kidney injury and paroxysmal A. fib. Palliative care consulted and plans to meet with family on 06/13/16 for goals of care.   Assessment & Plan:   Principal Problem:   Sepsis ([place]) Active Problems:   Dehydration with hypernatremia   Seizure disorder (HCC)   Schizophrenia (HCC)   Diabetes mellitus type 2 in nonobese (HCC)   Acute kidney injury ([place])   PAF (paroxysmal atrial fibrillation) (HCC)   Transaminitis   Constipation   Dementia   Protein-calorie malnutrition, severe   1. Sepsis: Patient met sepsis criteria on admission. Etiology unclear. Treating empirically with IV Zosyn and vancomycin. Lactate has normalized. Pro-calcitonin elevated at 6.16. Blood cultures 2: Negative to date. Urine culture: Negative. RSV panel: Negative. Sepsis physiology improved. Repeat chest x-ray without active disease. 2. Acute kidney injury: Baseline creatinine in the 1.2 range. Presented with creatinine of 2.2. Suspect multifactorial secondary to dehydration and sepsis physiology. Improving with IV fluids. Trend creatinine. 3. Paroxysmal A. fib: As per review of EMS telemetry strips by admitting team, most consistent with PAF. Required emergent cardioversion in the field. Remains in sinus rhythm.  Likely precipitated by acute illness/sepsis physiology. chadvasc=4 but not a candidate for anticoagulation due to advanced age, advanced dementia/schizophrenia and high risk for bleeding complications. 2-D echo shows LVEF 60-65 percent and grade 1 diastolic dysfunction. 4. Dehydration with hypernatremia: Continue IV fluids and follow clinically. Patient nothing by mouth due to risk for aspiration pending speech therapy evaluation. 5. Seizure disorder: Patient was found unresponsive and hypotensive. Unable to tell if her initial presentation was postictal. Mild rhabdomyolysis. Continue IV Depakote. 6. Type II DM: Holding metformin due to acute kidney injury. SSI. A1c: 6.2 7. Macrocytic anemia: Stable. 8. Elevated troponin: May be due to demand ischemia from rapid A. fib, hemodynamic instability and sepsis physiology. 2-D echo has normal EF without wall motion abnormalities. Will not be a candidate for any aggressive intervention. 9. Dementia/schizophrenia: Baseline mental status not clear. As per discussion with family, mental status is close to her baseline. 10. Adult failure to thrive: Patient appears chronically ill, seems to have lower extremity contractures and may have advanced dementia. Palliative care has been consulted and planned to meet with family on 12/31 for goals of care.  11. Constipation: Bowel regimen   DVT prophylaxis: Lovenox  Code Status: DO NOT RESUSCITATE  Family Communication: Discussed in detail with patient's granddaughter. Updated care and answered questions. Since they were here, they were willing to meet with palliative care team were unable to accommodate them today. Disposition Plan: To be determined  Consultants:   Palliative care team   Procedures:   2-D echo: Study Conclusions  - Left ventricle: The cavity size was normal. Wall thickness was   increased in a pattern of severe LVH. Systolic function was   normal. The estimated ejection fraction was  in the  range of 60%   to 65%. Wall motion was normal; there were no regional wall   motion abnormalities. Doppler parameters are consistent with   abnormal left ventricular relaxation (grade 1 diastolic   dysfunction). LV mid-cavity gradient, peak 27 mmHg. - Aortic valve: There was mild stenosis. There was trivial   regurgitation. Mean gradient (S): 11 mm Hg. Valve area (VTI):   1.55 cm^2. - Mitral valve: Severely calcified annulus. Severely calcified   leaflets . The findings are consistent with moderate stenosis.   There was no significant regurgitation. Mean gradient (D): 9 mm   Hg. - Left atrium: The atrium was moderately to severely dilated. - Right ventricle: The cavity size was normal. Systolic function   was normal. - Tricuspid valve: Peak RV-RA gradient (S): 24 mm Hg. - Pulmonary arteries: PA peak pressure: 32 mm Hg (S). - Systemic veins: IVC measured 1.4 cm with < 50% respirophasic   variation, suggesting RA pressure 8 mmHg.  Impressions:  - Normal LV size with severe LV hypertrophy. EF 60-65%. LV   mid-cavity gradient, peak 27 mmHg. Normal RV size and systolic   function. Mild aortic stenosis. At least moderate mitral   stenosis.  Antimicrobials:   IV vancomycin and Zosyn    Subjective: Patient is alert, oriented to self, unable to make out Harrell Gave conversation which is mumbling and incomprehensible. Does not follow instructions. As per RN, no acute issues.   Objective:  Vitals:   06/10/16 2147 06/11/16 0500 06/11/16 0535 06/11/16 1758  BP: (!) 137/58  (!) 125/53 138/85  Pulse: 95  80 87  Resp: '19  17 17  ' Temp: 97.7 F (36.5 C)  98.2 F (36.8 C) 99.3 F (37.4 C)  TempSrc: Oral  Axillary Oral  SpO2: 97%  98% 99%  Weight:  51.5 kg (113 lb 8 oz)      Intake/Output Summary (Last 24 hours) at 06/11/16 1759 Last data filed at 06/11/16 1502  Gross per 24 hour  Intake          1421.25 ml  Output                0 ml  Net          1421.25 ml   Filed Weights     06/10/16 1102 06/11/16 0500  Weight: 52.2 kg (115 lb) 51.5 kg (113 lb 8 oz)    Examination:  General exam: Elderly female, frail, chronically ill looking, lying comfortably propped up in bed, fidgety, not in obvious distress.  Respiratory system: Poor inspiratory effort, diminished breath sounds in the bases but otherwise clear to auscultation. Respiratory effort normal. Cardiovascular system: S1 & S2 heard, RRR. No JVD, murmurs, rubs, gallops or clicks. No pedal edema.Telemetry: Sinus rhythm.  Gastrointestinal system: Abdomen is nondistended, soft and nontender. No organomegaly or masses felt. Normal bowel sounds heard.Difficult to examine because of crouched position from possible contractures of her lower extremities.  Central nervous system: Alert and oriented Only to self. No focal neurological deficits. Extremities: Moves upper extremities symmetrically. Not much movement of her legs and seems to have contractures.  Skin: No rashes, lesions or ulcers Psychiatry: Judgement and insight impaired. Mood & affect appropriate.     Data Reviewed: I have personally reviewed following labs and imaging studies  CBC:  Recent Labs Lab 06/04/16 2054 06/10/16 0900 06/10/16 1014 06/11/16 0324  WBC 9.0 19.0* 10.7* 10.6*  NEUTROABS 6.6 14.0*  --   --   HGB 9.5* 4.1*  9.2* 8.7*  HCT 28.5* 12.6* 29.0* 27.1*  MCV 96.0 101.6* 102.5* 102.3*  PLT 243 455* 264 549   Basic Metabolic Panel:  Recent Labs Lab 06/04/16 2054 06/10/16 0900 06/10/16 1939 06/11/16 0324  NA 143 150*  --  150*  K 3.8 4.4  --  3.5  CL 105 107  --  115*  CO2 30 26  --  24  GLUCOSE 157* 194*  --  142*  BUN 27* 73*  --  69*  CREATININE 1.00 2.20*  --  1.81*  CALCIUM 9.0 9.9  --  8.5*  MG  --   --  2.4  --   PHOS  --   --  4.3  --    GFR: CrCl cannot be calculated (Unknown ideal weight.). Liver Function Tests:  Recent Labs Lab 06/10/16 0900 06/11/16 0324  AST 58* 59*  ALT 25 23  ALKPHOS 53 40  BILITOT  0.9 0.7  PROT 7.7 5.4*  ALBUMIN 2.5* 1.9*   No results for input(s): LIPASE, AMYLASE in the last 168 hours. No results for input(s): AMMONIA in the last 168 hours. Coagulation Profile:  Recent Labs Lab 06/10/16 1939  INR 1.16   Cardiac Enzymes:  Recent Labs Lab 06/10/16 1939 06/10/16 2045 06/11/16 0324  CKTOTAL 2,236*  --   --   TROPONINI 1.24* 1.31* 1.17*   BNP (last 3 results) No results for input(s): PROBNP in the last 8760 hours. HbA1C:  Recent Labs  06/10/16 1939  HGBA1C 6.2*   CBG:  Recent Labs Lab 06/10/16 2149 06/11/16 0047 06/11/16 0534 06/11/16 0815 06/11/16 1223  GLUCAP 86 111* 158* 151* 100*   Lipid Profile: No results for input(s): CHOL, HDL, LDLCALC, TRIG, CHOLHDL, LDLDIRECT in the last 72 hours. Thyroid Function Tests: No results for input(s): TSH, T4TOTAL, FREET4, T3FREE, THYROIDAB in the last 72 hours. Anemia Panel:  Recent Labs  06/10/16 1014  VITAMINB12 6,868*  FOLATE 25.5  FERRITIN 818*  TIBC 151*  IRON 33  RETICCTPCT 1.4    Sepsis Labs:  Recent Labs Lab 06/10/16 0914 06/10/16 1322 06/10/16 1939  PROCALCITON  --   --  6.16  LATICACIDVEN 4.76* 0.98  --     Recent Results (from the past 240 hour(s))  Blood Culture (routine x 2)     Status: None (Preliminary result)   Collection Time: 06/10/16  9:00 AM  Result Value Ref Range Status   Specimen Description BLOOD RIGHT ARM UPPER  Final   Special Requests BOTTLES DRAWN AEROBIC AND ANAEROBIC  5CC  Final   Culture NO GROWTH 1 DAY  Final   Report Status PENDING  Incomplete  Blood Culture (routine x 2)     Status: None (Preliminary result)   Collection Time: 06/10/16  9:19 AM  Result Value Ref Range Status   Specimen Description BLOOD RIGHT WRIST  Final   Special Requests BOTTLES DRAWN AEROBIC ONLY  3CC  Final   Culture NO GROWTH 1 DAY  Final   Report Status PENDING  Incomplete  Urine culture     Status: None   Collection Time: 06/10/16 11:10 AM  Result Value Ref Range  Status   Specimen Description URINE, RANDOM  Final   Special Requests NONE  Final   Culture NO GROWTH  Final   Report Status 06/11/2016 FINAL  Final  Respiratory Panel by PCR     Status: None   Collection Time: 06/10/16 10:21 PM  Result Value Ref Range Status   Adenovirus NOT DETECTED  NOT DETECTED Final   Coronavirus 229E NOT DETECTED NOT DETECTED Final   Coronavirus HKU1 NOT DETECTED NOT DETECTED Final   Coronavirus NL63 NOT DETECTED NOT DETECTED Final   Coronavirus OC43 NOT DETECTED NOT DETECTED Final   Metapneumovirus NOT DETECTED NOT DETECTED Final   Rhinovirus / Enterovirus NOT DETECTED NOT DETECTED Final   Influenza A NOT DETECTED NOT DETECTED Final   Influenza B NOT DETECTED NOT DETECTED Final   Parainfluenza Virus 1 NOT DETECTED NOT DETECTED Final   Parainfluenza Virus 2 NOT DETECTED NOT DETECTED Final   Parainfluenza Virus 3 NOT DETECTED NOT DETECTED Final   Parainfluenza Virus 4 NOT DETECTED NOT DETECTED Final   Respiratory Syncytial Virus NOT DETECTED NOT DETECTED Final   Bordetella pertussis NOT DETECTED NOT DETECTED Final   Chlamydophila pneumoniae NOT DETECTED NOT DETECTED Final   Mycoplasma pneumoniae NOT DETECTED NOT DETECTED Final  MRSA PCR Screening     Status: None   Collection Time: 06/11/16  6:01 AM  Result Value Ref Range Status   MRSA by PCR NEGATIVE NEGATIVE Final    Comment:        The GeneXpert MRSA Assay (FDA approved for NASAL specimens only), is one component of a comprehensive MRSA colonization surveillance program. It is not intended to diagnose MRSA infection nor to guide or monitor treatment for MRSA infections.          Radiology Studies: X-ray Chest Pa And Lateral  Result Date: 06/11/2016 CLINICAL DATA:  Sepsis EXAM: CHEST  2 VIEW COMPARISON:  06/10/2016 FINDINGS: Mild cardiomegaly. Lungs are clear. No effusions. Aortic tortuosity and calcifications. No acute bony abnormality. IMPRESSION: Mild cardiomegaly.  No active disease.  Aortic atherosclerosis. Electronically Signed   By: Rolm Baptise M.D.   On: 06/11/2016 08:20   Ct Head Wo Contrast  Result Date: 06/10/2016 CLINICAL DATA:  Code sepsis EXAM: CT HEAD WITHOUT CONTRAST TECHNIQUE: Contiguous axial images were obtained from the base of the skull through the vertex without intravenous contrast. COMPARISON:  04/26/2016 FINDINGS: Brain: Severe chronic small vessel disease changes throughout the deep white matter. Diffuse cerebral atrophy. Physiologic calcifications in the basal ganglia bilaterally. No acute intracranial abnormality. Specifically, no hemorrhage, hydrocephalus, mass lesion, acute infarction, or significant intracranial injury. Vascular: No hyperdense vessel or unexpected calcification. Skull: No acute calvarial abnormality. Sinuses/Orbits: Visualized paranasal sinuses and mastoids clear. Orbital soft tissues unremarkable. Other: None IMPRESSION: Severe chronic small vessel disease changes and diffuse cerebral atrophy. No acute intracranial abnormality. Electronically Signed   By: Rolm Baptise M.D.   On: 06/10/2016 10:03   Dg Chest Port 1 View  Result Date: 06/10/2016 CLINICAL DATA:  Fever and altered mental status EXAM: PORTABLE CHEST 1 VIEW COMPARISON:  April 16, 2016 FINDINGS: There is a probable nipple shadow at the right base. There is no edema or consolidation. Heart is upper normal in size with pulmonary vascularity within normal limits. No adenopathy. There is atherosclerotic calcification in the aorta. No bone lesions. IMPRESSION: Suspect nipple shadow on the right; repeat study with nipple markers could be helpful to confirm. No edema or consolidation. Stable cardiac silhouette. There is aortic atherosclerosis. Electronically Signed   By: Lowella Grip III M.D.   On: 06/10/2016 09:13   Dg Abd Portable 1v  Result Date: 06/10/2016 CLINICAL DATA:  Constipation. Altered mental status. History of diabetes. EXAM: PORTABLE ABDOMEN - 1 VIEW COMPARISON:   Limited correlation made with hip radiographs 06/01/2016. FINDINGS: 1358 hours. The bowel gas pattern is nonobstructive. Colonic stool burden does  not appear significantly increased. No supine evidence of free intraperitoneal air. Aortoiliac atherosclerosis and probable pelvic phleboliths noted. There are degenerative changes in the lower lumbar spine. IMPRESSION: No acute abdominal findings.  Nonobstructive bowel gas pattern. Electronically Signed   By: Richardean Sale M.D.   On: 06/10/2016 14:07        Scheduled Meds: . chlorhexidine  15 mL Mouth Rinse BID  . enoxaparin (LOVENOX) injection  30 mg Subcutaneous Q24H  . insulin aspart  0-9 Units Subcutaneous Q4H  . mouth rinse  15 mL Mouth Rinse q12n4p  . piperacillin-tazobactam (ZOSYN)  IV  2.25 g Intravenous Q6H  . sodium chloride flush  3 mL Intravenous Q12H  . valproate sodium  500 mg Intravenous Q12H  . [START ON 06/12/2016] vancomycin  750 mg Intravenous Q48H   Continuous Infusions: . dextrose 5 % and 0.45% NaCl 75 mL/hr at 06/11/16 0057     LOS: 1 day      [facility], MD [facility] Pager [phone]  If 7PM-7AM, please contact night-coverage [email or web address] Password [email or web address] 06/11/2016, 5:59 PM

## 2016-06-11 NOTE — Progress Notes (Signed)
  Echocardiogram 2D Echocardiogram has been performed.  Faith Wright, Faith Wright 06/11/2016, 12:23 PM

## 2016-06-11 NOTE — Evaluation (Signed)
Clinical/Bedside Swallow Evaluation Patient Details  Name: Faith Wright MRN: [id number] Date of Birth: 06-23-1933  Today's Date: 06/11/2016 Time: SLP Start Time (ACUTE ONLY): 0858 SLP Stop Time (ACUTE ONLY): 0921 SLP Time Calculation (min) (ACUTE ONLY): 23 min  Past Medical History:  Past Medical History:  Diagnosis Date  . Dementia   . Diabetes mellitus without complication (HCC)   . DVT (deep vein thrombosis) in pregnancy (HCC)   . Hypertension   . Schizophrenia [facility](HCC)    Past Surgical History: History reviewed. No pertinent surgical history. HPI:  Patient is an 82 year old female with past medical history of dementia, schizophrenia, diabetes, hypertension, recurrent UTI. Recently seen in ED (06/01/16) after a fall at her nursing facility. Presented 06/10/16 with AMS, admitted with sepsis and dehydration. Lungs clear per Chest X- Ray 06/11/16.  Head CT 06/10/16 showed severe chronic small vessel disease changes and diffuse cerebral atrophy, with no acute findings. No prior SLP evaluations found in chart. Referred for bedside swallowing evaluation.   Assessment / Plan / Recommendation Clinical Impression  Patient does not display readiness for PO intake at this time due to reduced level of alertness and cognitive deficits which appear to impact bolus awareness. Initially patient was alert and made efforts to follow some basic commands. Demonstrated reflexive chewing to tactile stimulus. Tolerated ice chips x2 and teaspoon thin water x2 with no overt signs of aspiration. Patient was unable to retrieve bolus from cup or straw despite tactile and verbal cues due to sensory deficits. Presented teaspoon pureed texture; patient demonstrated bolus holding with absent oral manipulation or swallow initiation. Required max verbal and tactile cues for swallow. Ceased trials due to patient lethargy. Recommend continuing NPO status with medications via alternative means. Patient may have ice chips after  oral care when alert. SLP to follow up to determine PO readiness.    Aspiration Risk  Severe aspiration risk;Risk for inadequate nutrition/hydration    Diet Recommendation NPO;Ice chips PRN after oral care   Liquid Administration via: Spoon Medication Administration: Via alternative means Supervision: Full supervision/cueing for compensatory strategies Compensations: Other (Comment) (Ice chips after oral care when alert) Postural Changes: Seated upright at 90 degrees    Other  Recommendations Oral Care Recommendations: Oral care QID   Follow up Recommendations Skilled Nursing facility      Frequency and Duration min 2x/week  2 weeks       Prognosis Prognosis for Safe Diet Advancement: Fair Barriers to Reach Goals: Cognitive deficits      Swallow Study   General Date of Onset: 06/10/16 HPI: Patient is an 82 year old female with past medical history of dementia, schizophrenia, diabetes, hypertension, recurrent UTI. Recently seen in ED (06/01/16) after a fall at her nursing facility. Presented 06/10/16 with AMS, admitted with sepsis and dehydration. Lungs clear per Chest X- Ray 06/11/16.  Head CT 06/10/16 showed severe chronic small vessel disease changes and diffuse cerebral atrophy, with no acute findings. No prior SLP evaluations found in chart. Referred for bedside swallowing evaluation. Type of Study: Bedside Swallow Evaluation Previous Swallow Assessment: none found Diet Prior to this Study: NPO Temperature Spikes Noted: No Respiratory Status: Room air History of Recent Intubation: No Behavior/Cognition: Lethargic/Drowsy;Requires cueing Oral Cavity Assessment: Dried secretions;Other (comment) (thick lingual coating) Oral Care Completed by SLP: Yes Oral Cavity - Dentition: Edentulous Vision:  (could not assess) Self-Feeding Abilities: Total assist Patient Positioning: Upright in bed Baseline Vocal Quality: Low vocal intensity;Breathy Volitional Cough: Cognitively unable  to elicit Volitional Swallow: Unable to elicit  Oral/Motor/Sensory Function Overall Oral Motor/Sensory Function: Generalized oral weakness Facial ROM: Reduced right;Reduced left Facial Symmetry: Within Functional Limits Facial Strength: Reduced right;Reduced left Facial Sensation: Reduced right;Reduced left Lingual ROM: Reduced right;Reduced left Lingual Symmetry: Within Functional Limits Lingual Strength: Reduced Lingual Sensation: Reduced Velum: Other (comment) (limited assessment due to cognitive status) Mandible:  (limited assessment due to cognitive status)   Ice Chips Ice chips: Within functional limits Presentation: Spoon   Thin Liquid Thin Liquid: Impaired Presentation: Spoon;Cup Oral Phase Impairments: Reduced labial seal;Reduced lingual movement/coordination;Poor awareness of bolus Other Comments: Unable to retrieve bolus from cup or straw    Nectar Thick Nectar Thick Liquid: Not tested   Honey Thick Honey Thick Liquid: Not tested   Puree Puree: Impaired Presentation: Spoon Oral Phase Impairments: Reduced labial seal;Poor awareness of bolus;Reduced lingual movement/coordination Oral Phase Functional Implications: Oral holding   Solid   GO   Faith Wright, [place] CF-SLP Speech-Language Pathologist [phone] Solid: Not tested        Faith Wright 06/11/2016,9:40 AM

## 2016-06-11 NOTE — Progress Notes (Signed)
Palliative Care  Mrs. Aurther Lofterry is unable to meaningfully participate in conversations about her goals of care. I contacted her son, Melina CopaMarvin Harrington ([phone]), who is next of kin. His daughter, Edwena FeltyLanesha Harrington, answered the phone and explained that her dad has dysphasia and has trouble verbally communicating, especially over the phone. He is able to understand everything and has been able to communicate his preferences. Per Jetty PeeksLanesha, the pt does have another son but he is not involved at all. Jetty PeeksLanesha provides transportation for her father, and reports that she is working today and Saturday, and asked if a meeting could occur on Sunday when she if off work (she could be available to come to the hospital if it were an emergency). Family meeting planned for Sunday; she asked that a Palliative provider call on Sunday morning to schedule a time, as she isn't sure when she will be coming in that day.   Murrell ConverseSarah Vonita Calloway AGNP-C Palliative Care [phone] (cell phone M-F 7a-5p) [phone] (team phone for over the weekend)  No charge note.

## 2016-06-11 NOTE — Progress Notes (Addendum)
Initial Nutrition Assessment  DOCUMENTATION CODES:   Severe malnutrition in context of chronic illness  INTERVENTION:   -RD will follow for diet advancement and results of goals of care discussion and adjust plan as necessary  NUTRITION DIAGNOSIS:   Malnutrition related to chronic illness as evidenced by severe depletion of muscle mass, severe depletion of body fat.  GOAL:   Patient will meet greater than or equal to 90% of their needs  MONITOR:   Diet advancement, Labs, Weight trends, Skin, I & O's  REASON FOR ASSESSMENT:   Low Braden    ASSESSMENT:   Nance PewMarie Bahr is a [age] y.o. female who presents with presumed sepsis and severe dehydration.   Pt admitted with sepsis. [facility].   No family members or caregivers present at time of visit. Pt unable to participate in interview.   SLP evaluated this AM; pt too lethargic to take PO's at this time.   Nutrition-Focused physical exam completed. Findings are moderate to severe fat depletion, moderate to severe muscle depletion, and no edema.   Given exam results, suspect poor oral intake and weight loss PTA, however, unable to confirm at this time.   Palliative care team following; plan to organize goals of care meeting next week.   Labs reviewed: CBGS: 100-151.   Diet Order:  Diet NPO time specified  Skin:  Reviewed, no issues  Last BM:  06/11/16  Height:   Ht Readings from Last 1 Encounters:  No data found for Ht   Weight:   Wt Readings from Last 1 Encounters:  06/11/16 113 lb 8 oz (51.5 kg)    Ideal Body Weight:     BMI:  There is no height or weight on file to calculate BMI.  Estimated Nutritional Needs:   Kcal:  1300-1500  Protein:  60-75 grams  Fluid:  1.3-1.5 L  EDUCATION NEEDS:   No education needs identified at this time  Leith Szafranski A. Mayford KnifeWilliams, RD, LDN, CDE Pager: [phone] After hours Pager: [phone]

## 2016-06-12 ENCOUNTER — Encounter (HOSPITAL_COMMUNITY): Payer: Self-pay | Admitting: Surgery

## 2016-06-12 DIAGNOSIS — I4891 Unspecified atrial fibrillation: Secondary | ICD-10-CM

## 2016-06-12 LAB — CK: CK TOTAL: 1553 U/L — AB (ref 38–234)

## 2016-06-12 LAB — GLUCOSE, CAPILLARY
Glucose-Capillary: 146 mg/dL — ABNORMAL HIGH (ref 65–99)
Glucose-Capillary: 131 mg/dL — ABNORMAL HIGH (ref 65–99)
Glucose-Capillary: 179 mg/dL — ABNORMAL HIGH (ref 65–99)
Glucose-Capillary: 171 mg/dL — ABNORMAL HIGH (ref 65–99)
Glucose-Capillary: 169 mg/dL — ABNORMAL HIGH (ref 65–99)

## 2016-06-12 LAB — CBC
HCT: 30.1 % — ABNORMAL LOW (ref 36.0–46.0)
Hemoglobin: 9.7 g/dL — ABNORMAL LOW (ref 12.0–15.0)
MCH: 32.7 pg (ref 26.0–34.0)
MCHC: 32.2 g/dL (ref 30.0–36.0)
MCV: 101.3 fL — AB (ref 78.0–100.0)
PLATELETS: 277 10*3/uL (ref 150–400)
RBC: 2.97 MIL/uL — AB (ref 3.87–5.11)
RDW: 15.7 % — ABNORMAL HIGH (ref 11.5–15.5)
WBC: 9.3 10*3/uL (ref 4.0–10.5)

## 2016-06-12 LAB — BASIC METABOLIC PANEL
ANION GAP: 8 (ref 5–15)
BUN: 54 mg/dL — ABNORMAL HIGH (ref 6–20)
CO2: 27 mmol/L (ref 22–32)
Calcium: 8.6 mg/dL — ABNORMAL LOW (ref 8.9–10.3)
Chloride: 115 mmol/L — ABNORMAL HIGH (ref 101–111)
Creatinine, Ser: 1.4 mg/dL — ABNORMAL HIGH (ref 0.44–1.00)
GFR calc Af Amer: 40 mL/min — ABNORMAL LOW (ref >60)
GFR, EST NON AFRICAN AMERICAN: 34 mL/min — AB (ref >60)
Glucose, Bld: 160 mg/dL — ABNORMAL HIGH (ref 65–99)
POTASSIUM: 3.1 mmol/L — AB (ref 3.5–5.1)
Sodium: 150 mmol/L — ABNORMAL HIGH (ref 135–145)

## 2016-06-12 LAB — PROCALCITONIN: Procalcitonin: 3.15 ng/mL

## 2016-06-12 LAB — MAGNESIUM: MAGNESIUM: 2.3 mg/dL (ref 1.7–2.4)

## 2016-06-12 MED ORDER — PIPERACILLIN-TAZOBACTAM 3.375 G IVPB
3.3750 g | Freq: Three times a day (TID) | INTRAVENOUS | Status: DC
  Administered 2016-06-12 – 2016-06-13 (×3): 3.375 g via INTRAVENOUS
  Filled 2016-06-12 (×3): qty 50

## 2016-06-12 MED ORDER — METOPROLOL TARTRATE 12.5 MG HALF TABLET: 12.5000 mg | ORAL_TABLET | Freq: Once | ORAL | Status: DC

## 2016-06-12 MED ORDER — VANCOMYCIN HCL 500 MG IV SOLR: 500.0000 mg | INTRAVENOUS | Status: DC

## 2016-06-12 MED ORDER — METOPROLOL TARTRATE 5 MG/5ML IV SOLN
2.5000 mg | Freq: Once | INTRAVENOUS | Status: AC
  Administered 2016-06-12: 2.5 mg via INTRAVENOUS
  Filled 2016-06-12: qty 5

## 2016-06-12 MED ORDER — SODIUM CHLORIDE 0.9 % IV SOLN
30.0000 meq | Freq: Once | INTRAVENOUS | Status: AC
  Administered 2016-06-12: 30 meq via INTRAVENOUS
  Filled 2016-06-12: qty 15

## 2016-06-12 MED ORDER — METOPROLOL TARTRATE 5 MG/5ML IV SOLN
5.0000 mg | INTRAVENOUS | Status: DC | PRN
  Administered 2016-06-12 (×3): 5 mg via INTRAVENOUS
  Filled 2016-06-12: qty 5

## 2016-06-12 MED ORDER — METOPROLOL TARTRATE 5 MG/5ML IV SOLN
5.0000 mg | Freq: Four times a day (QID) | INTRAVENOUS | Status: DC
  Administered 2016-06-13 – 2016-06-16 (×12): 5 mg via INTRAVENOUS
  Filled 2016-06-12 (×12): qty 5

## 2016-06-12 MED ORDER — POTASSIUM CHLORIDE 2 MEQ/ML IV SOLN
INTRAVENOUS | Status: AC
  Administered 2016-06-12: 17:00:00 via INTRAVENOUS
  Filled 2016-06-12 (×3): qty 1000

## 2016-06-12 NOTE — Progress Notes (Signed)
Patient HR elevated to 150's afib, O2 placed at 2L and  Metoprolol given x 2 .  Vitals are stable. Patient currently in the 110's, and resting comfortably.

## 2016-06-12 NOTE — Progress Notes (Signed)
Speech Language Pathology Treatment: Dysphagia  Patient Details Name: Faith Wright MRN: [id number] DOB: 12/08/1933 Today's Date: 06/12/2016 Time: [id number] SLP Time Calculation (min) (ACUTE ONLY): 16 min  Assessment / Plan / Recommendation Clinical Impression  Pt alert upon SLP arrival, after repositioning and oral care pt smiling, speaking some short phrases. Pt was able to take sips from a cup, dipping mouth down appropriately to water but still unable to sip from a straw. Lingual pumping observed with puree causing prolonged oral phase but eventual swallow response with no pocketing. Pt was eager to eat and drink and showed no signs of aspiration. Recommend initiating a dys 1 (puree) diet with thin liquids with full assist and appropriate repositioning for upright posture.    HPI HPI: Patient is an 82 year old female with past medical history of dementia, schizophrenia, diabetes, hypertension, recurrent UTI. Recently seen in ED (06/01/16) after a fall at her nursing facility. Presented 06/10/16 with AMS, admitted with sepsis and dehydration. Lungs clear per Chest X- Ray 06/11/16.  Head CT 06/10/16 showed severe chronic small vessel disease changes and diffuse cerebral atrophy, with no acute findings. No prior SLP evaluations found in chart. Referred for bedside swallowing evaluation.      SLP Plan  Continue with current plan of care     Recommendations  Diet recommendations: Dysphagia 1 (puree);Thin liquid Liquids provided via: Cup;Straw Medication Administration: Crushed with puree Supervision: Full supervision/cueing for compensatory strategies;Staff to assist with self feeding Compensations: Slow rate;Small sips/bites Postural Changes and/or Swallow Maneuvers: Seated upright 90 degrees                Plan: Continue with current plan of care       GO               [facility] Health CenterBonnie Sargun Rummell, MA CCC-SLP [phone]  Claudine MoutonDeBlois, Raziah Funnell Caroline 06/12/2016, 2:40 PM

## 2016-06-12 NOTE — Progress Notes (Addendum)
At 0555 patient had a 9-beat run of v-tach. Pt asymptomatic and vital signs stable. BP after run of v.tach was 146/71 with HR 72. On-call [facility]ad hospitalist notified and placed order for 2.5mg  of IV lopressor. One time order of lopressor given at [phone]. Patient resting comfortably in bed. Will continue to monitor and treat per MD orders.

## 2016-06-12 NOTE — Progress Notes (Signed)
Pharmacy Antibiotic Note  Faith Wright is a 82 y.o. female admitted on 06/10/2016 with sepsis.  Pharmacy has been consulted for vancomycin and zosyn dosing.  Patient presented with altered mental status and tachycardia. Her serum creatinine has improved to 1.4, with an estimated CrCL ~25-30 mL/min. Will adjust her antibiotics for improved renal function.   Plan: Vancomycin 500mg  IV every 24 hours Zosyn 3.375gm IV every 8 hours Monitor renal function, clinical progress, and vancomycin trough as indicated    Weight: 109 lb 4.8 oz (49.6 kg)  Temp (24hrs), Avg:98.2 F (36.8 C), Min:97.4 F (36.3 C), Max:99.3 F (37.4 C)   Recent Labs Lab 06/10/16 0900 06/10/16 0914 06/10/16 1014 06/10/16 1322 06/11/16 0324 06/12/16 0609  WBC 19.0*  --  10.7*  --  10.6* 9.3  CREATININE 2.20*  --   --   --  1.81* 1.40*  LATICACIDVEN  --  4.76*  --  0.98  --   --     CrCl cannot be calculated (Unknown ideal weight.).    No Known Allergies  Antimicrobials this admission:  12/28 zosyn >>  12/28 vanc >>   Dose adjustments this admission:  12/30 Zosyn 2.25 q6h > 3.375 q8h, Vanc 750mg  q48h > 500mg  q24h (improved renal funct)  Microbiology results:  12/28 BCx: NGTD 12/28 UCx: no growth final 12/28 MRSA PCR: negative 12/28 Resp Panel: negative  Thank you for allowing pharmacy to be a part of this patient's care.  Allie BossierApryl Kilah Drahos, PharmD PGY1 Pharmacy Resident [phone] (Pager) 06/12/2016 1:31 PM

## 2016-06-12 NOTE — Progress Notes (Signed)
PROGRESS NOTE  Faith Wright  OIZ:[id number] DOB: Mar 24, 1934  DOA: 06/10/2016 PCP: Lesia Hausen, PA   Brief Narrative:  82 year old female with PMH of remote DVT, DM 2, dementia, seizure disorder, schizophrenia, 3 ER visits since November (11/13: Generalized weakness, 12/19: Hip pain after fall, 12/22: Knee pain after fall), no significant injuries found on these evaluations and patient was returned to SNF, presented to [facility] ED on 06/10/16 after she was apparently found unresponsive at the SNF and upon EMS arrival heart rate of 250, underwent emergent cardioversion with 200 J with subsequent conversion to sinus rhythm/sinus tachycardia and was noted to be febrile 103F. Admitted for sepsis of unclear etiology, acute kidney injury and paroxysmal A. fib. Palliative care consulted and plans to meet with family on 06/13/16 for goals of care.   Assessment & Plan:   Principal Problem:   Sepsis ([place]) Active Problems:   Dehydration with hypernatremia   Seizure disorder (HCC)   Schizophrenia (HCC)   Diabetes mellitus type 2 in nonobese (HCC)   Acute kidney injury ([place])   PAF (paroxysmal atrial fibrillation) (HCC)   Transaminitis   Constipation   Dementia   Protein-calorie malnutrition, severe   1. Sepsis: Patient met sepsis criteria on admission. Etiology unclear. Treating empirically with IV Zosyn and vancomycin. Lactate has normalized. Pro-calcitonin elevated at 6.16. Blood cultures 2: Negative to date. Urine culture: Negative. RSV panel: Negative. Sepsis physiology resolved. Repeat chest x-ray without active disease.? All nonspecific viral illness. Has defervesced and without fever since 12/28. Discontinue vancomycin 12/30. If cultures continue to be negative then consider stopping all antibiotics on 12/31 and monitoring clinically. 2. Acute kidney injury: Baseline creatinine in the 1.2 range. Presented with creatinine of 2.2. Suspect multifactorial secondary to dehydration and sepsis physiology.  Improving with IV fluids. Trend creatinine. 3. Paroxysmal A. fib with RVR: As per review of EMS telemetry strips by admitting team, most consistent with PAF. Required emergent cardioversion in the field. Remains in sinus rhythm. Likely precipitated by acute illness/sepsis physiology. chadvasc=4 but not a candidate for anticoagulation due to advanced age, advanced dementia/schizophrenia and high risk for bleeding complications. 2-D echo shows LVEF 60-65 percent and grade 1 diastolic dysfunction. Again rapid A. fib in the 130s-140s on 12/30 morning. Reverted to sinus rhythm after metoprolol 5 mg IV every 5 minutes 2 doses. Started on scheduled IV metoprolol 5 MG every 6 hours. Still nothing by mouth. 4. Dehydration with hypernatremia: Continue IV fluids and follow clinically. Patient nothing by mouth due to risk for aspiration pending speech therapy evaluation. Change IV fluids to D5 infusion. 5. Seizure disorder: Patient was found unresponsive and hypotensive. Unable to tell if her initial presentation was postictal. Mild rhabdomyolysis-improving. Continue IV Depakote. 6. Type II DM: Holding metformin due to acute kidney injury. SSI. A1c: 6.2 7. Macrocytic anemia: Stable. 8. Elevated troponin: May be due to demand ischemia from rapid A. fib, hemodynamic instability and sepsis physiology. 2-D echo has normal EF without wall motion abnormalities. Will not be a candidate for any aggressive intervention. 9. Dementia/schizophrenia: Baseline mental status not clear. To be determined after discussing with family. Current mental status may well be her baseline. 10. Adult failure to thrive: Patient appears chronically ill, seems to have lower extremity contractures and may have advanced dementia. Palliative care has been consulted and planned to meet with family on 12/31 for goals of care.  11. Constipation: Bowel regimen 12. Hypokalemia: Replace IV and follow. Magnesium 2.3.   DVT prophylaxis: Lovenox  Code  Status: DO  NOT RESUSCITATE  Family Communication: None at bedside  Disposition Plan: To be determined  Consultants:   Palliative care team   Procedures:   2-D echo: Study Conclusions  - Left ventricle: The cavity size was normal. Wall thickness was   increased in a pattern of severe LVH. Systolic function was   normal. The estimated ejection fraction was in the range of 60%   to 65%. Wall motion was normal; there were no regional wall   motion abnormalities. Doppler parameters are consistent with   abnormal left ventricular relaxation (grade 1 diastolic   dysfunction). LV mid-cavity gradient, peak 27 mmHg. - Aortic valve: There was mild stenosis. There was trivial   regurgitation. Mean gradient (S): 11 mm Hg. Valve area (VTI):   1.55 cm^2. - Mitral valve: Severely calcified annulus. Severely calcified   leaflets . The findings are consistent with moderate stenosis.   There was no significant regurgitation. Mean gradient (D): 9 mm   Hg. - Left atrium: The atrium was moderately to severely dilated. - Right ventricle: The cavity size was normal. Systolic function   was normal. - Tricuspid valve: Peak RV-RA gradient (S): 24 mm Hg. - Pulmonary arteries: PA peak pressure: 32 mm Hg (S). - Systemic veins: IVC measured 1.4 cm with < 50% respirophasic   variation, suggesting RA pressure 8 mmHg.  Impressions:  - Normal LV size with severe LV hypertrophy. EF 60-65%. LV   mid-cavity gradient, peak 27 mmHg. Normal RV size and systolic   function. Mild aortic stenosis. At least moderate mitral   stenosis.  Antimicrobials:   IV vancomycin and Zosyn    Subjective: Patient is alert, oriented to self. Unable to make out the rest of her speech-low tone and mumbled.  Objective:  Vitals:   06/12/16 1013 06/12/16 1015 06/12/16 1020 06/12/16 1025  BP: (!) 148/105 113/62 (!) 123/58 105/64  Pulse: (!) 131 (!) 180 (!) 101 93  Resp:      Temp:      TempSrc:      SpO2:      Weight:       Temperature 31F, respiratory rate 19 per minute and oxygen saturation 96%  Intake/Output Summary (Last 24 hours) at 06/12/16 1334 Last data filed at 06/12/16 0900  Gross per 24 hour  Intake            907.5 ml  Output              700 ml  Net            207.5 ml   Filed Weights   06/10/16 1102 06/11/16 0500 06/12/16 0501  Weight: 52.2 kg (115 lb) 51.5 kg (113 lb 8 oz) 49.6 kg (109 lb 4.8 oz)    Examination:  General exam: Elderly female, frail, chronically ill looking, lying comfortably propped up in bed, fidgety, not in obvious distress.  Respiratory system: Poor inspiratory effort, diminished breath sounds in the bases but otherwise clear to auscultation. Respiratory effort normal. Cardiovascular system: S1 & S2 heard, RRR. No JVD, murmurs, rubs, gallops or clicks. No pedal edema.Telemetry: Was in A. fib with RVR from approximately 8:30 this morning for a couple of hours. Reverted to sinus rhythm. Gastrointestinal system: Abdomen is nondistended, soft and nontender. No organomegaly or masses felt. Normal bowel sounds heard.Difficult to examine because of crouched position from possible contractures of her lower extremities.  Central nervous system: Alert and oriented Only to self. No focal neurological deficits. Extremities: Moves upper extremities symmetrically.  Not much movement of her legs and seems to have contractures.  Skin: No rashes, lesions or ulcers Psychiatry: Judgement and insight impaired. Mood & affect appropriate.     Data Reviewed: I have personally reviewed following labs and imaging studies  CBC:  Recent Labs Lab 06/10/16 0900 06/10/16 1014 06/11/16 0324 06/12/16 0609  WBC 19.0* 10.7* 10.6* 9.3  NEUTROABS 14.0*  --   --   --   HGB 4.1* 9.2* 8.7* 9.7*  HCT 12.6* 29.0* 27.1* 30.1*  MCV 101.6* 102.5* 102.3* 101.3*  PLT 455* 264 250 314   Basic Metabolic Panel:  Recent Labs Lab 06/10/16 0900 06/10/16 1939 06/11/16 0324 06/12/16 0609  NA 150*   --  150* 150*  K 4.4  --  3.5 3.1*  CL 107  --  115* 115*  CO2 26  --  24 27  GLUCOSE 194*  --  142* 160*  BUN 73*  --  69* 54*  CREATININE 2.20*  --  1.81* 1.40*  CALCIUM 9.9  --  8.5* 8.6*  MG  --  2.4  --  2.3  PHOS  --  4.3  --   --    GFR: CrCl cannot be calculated (Unknown ideal weight.). Liver Function Tests:  Recent Labs Lab 06/10/16 0900 06/11/16 0324  AST 58* 59*  ALT 25 23  ALKPHOS 53 40  BILITOT 0.9 0.7  PROT 7.7 5.4*  ALBUMIN 2.5* 1.9*   No results for input(s): LIPASE, AMYLASE in the last 168 hours. No results for input(s): AMMONIA in the last 168 hours. Coagulation Profile:  Recent Labs Lab 06/10/16 1939  INR 1.16   Cardiac Enzymes:  Recent Labs Lab 06/10/16 1939 06/10/16 2045 06/11/16 0324 06/12/16 0609  CKTOTAL 2,236*  --   --  1,553*  TROPONINI 1.24* 1.31* 1.17*  --    BNP (last 3 results) No results for input(s): PROBNP in the last 8760 hours. HbA1C:  Recent Labs  06/10/16 1939  HGBA1C 6.2*   CBG:  Recent Labs Lab 06/11/16 1958 06/11/16 2349 06/12/16 0451 06/12/16 0755 06/12/16 1229  GLUCAP 136* 109* 146* 131* 179*   Lipid Profile: No results for input(s): CHOL, HDL, LDLCALC, TRIG, CHOLHDL, LDLDIRECT in the last 72 hours. Thyroid Function Tests: No results for input(s): TSH, T4TOTAL, FREET4, T3FREE, THYROIDAB in the last 72 hours. Anemia Panel:  Recent Labs  06/10/16 1014  VITAMINB12 6,868*  FOLATE 25.5  FERRITIN 818*  TIBC 151*  IRON 33  RETICCTPCT 1.4    Sepsis Labs:  Recent Labs Lab 06/10/16 0914 06/10/16 1322 06/10/16 1939 06/12/16 0609  PROCALCITON  --   --  6.16 3.15  LATICACIDVEN 4.76* 0.98  --   --     Recent Results (from the past 240 hour(s))  Blood Culture (routine x 2)     Status: None (Preliminary result)   Collection Time: 06/10/16  9:00 AM  Result Value Ref Range Status   Specimen Description BLOOD RIGHT ARM UPPER  Final   Special Requests BOTTLES DRAWN AEROBIC AND ANAEROBIC  5CC   Final   Culture NO GROWTH 2 DAYS  Final   Report Status PENDING  Incomplete  Blood Culture (routine x 2)     Status: None (Preliminary result)   Collection Time: 06/10/16  9:19 AM  Result Value Ref Range Status   Specimen Description BLOOD RIGHT WRIST  Final   Special Requests BOTTLES DRAWN AEROBIC ONLY  3CC  Final   Culture NO GROWTH 2 DAYS  Final  Report Status PENDING  Incomplete  Urine culture     Status: None   Collection Time: 06/10/16 11:10 AM  Result Value Ref Range Status   Specimen Description URINE, RANDOM  Final   Special Requests NONE  Final   Culture NO GROWTH  Final   Report Status 06/11/2016 FINAL  Final  Respiratory Panel by PCR     Status: None   Collection Time: 06/10/16 10:21 PM  Result Value Ref Range Status   Adenovirus NOT DETECTED NOT DETECTED Final   Coronavirus 229E NOT DETECTED NOT DETECTED Final   Coronavirus HKU1 NOT DETECTED NOT DETECTED Final   Coronavirus NL63 NOT DETECTED NOT DETECTED Final   Coronavirus OC43 NOT DETECTED NOT DETECTED Final   Metapneumovirus NOT DETECTED NOT DETECTED Final   Rhinovirus / Enterovirus NOT DETECTED NOT DETECTED Final   Influenza A NOT DETECTED NOT DETECTED Final   Influenza B NOT DETECTED NOT DETECTED Final   Parainfluenza Virus 1 NOT DETECTED NOT DETECTED Final   Parainfluenza Virus 2 NOT DETECTED NOT DETECTED Final   Parainfluenza Virus 3 NOT DETECTED NOT DETECTED Final   Parainfluenza Virus 4 NOT DETECTED NOT DETECTED Final   Respiratory Syncytial Virus NOT DETECTED NOT DETECTED Final   Bordetella pertussis NOT DETECTED NOT DETECTED Final   Chlamydophila pneumoniae NOT DETECTED NOT DETECTED Final   Mycoplasma pneumoniae NOT DETECTED NOT DETECTED Final  MRSA PCR Screening     Status: None   Collection Time: 06/11/16  6:01 AM  Result Value Ref Range Status   MRSA by PCR NEGATIVE NEGATIVE Final    Comment:        The GeneXpert MRSA Assay (FDA approved for NASAL specimens only), is one component of  a comprehensive MRSA colonization surveillance program. It is not intended to diagnose MRSA infection nor to guide or monitor treatment for MRSA infections.          Radiology Studies: X-ray Chest Pa And Lateral  Result Date: 06/11/2016 CLINICAL DATA:  Sepsis EXAM: CHEST  2 VIEW COMPARISON:  06/10/2016 FINDINGS: Mild cardiomegaly. Lungs are clear. No effusions. Aortic tortuosity and calcifications. No acute bony abnormality. IMPRESSION: Mild cardiomegaly.  No active disease. Aortic atherosclerosis. Electronically Signed   By: Rolm Baptise M.D.   On: 06/11/2016 08:20   Dg Abd Portable 1v  Result Date: 06/10/2016 CLINICAL DATA:  Constipation. Altered mental status. History of diabetes. EXAM: PORTABLE ABDOMEN - 1 VIEW COMPARISON:  Limited correlation made with hip radiographs 06/01/2016. FINDINGS: 1358 hours. The bowel gas pattern is nonobstructive. Colonic stool burden does not appear significantly increased. No supine evidence of free intraperitoneal air. Aortoiliac atherosclerosis and probable pelvic phleboliths noted. There are degenerative changes in the lower lumbar spine. IMPRESSION: No acute abdominal findings.  Nonobstructive bowel gas pattern. Electronically Signed   By: Richardean Sale M.D.   On: 06/10/2016 14:07        Scheduled Meds: . chlorhexidine  15 mL Mouth Rinse BID  . enoxaparin (LOVENOX) injection  30 mg Subcutaneous Q24H  . insulin aspart  0-9 Units Subcutaneous Q4H  . mouth rinse  15 mL Mouth Rinse q12n4p  . metoprolol  5 mg Intravenous Q6H  . piperacillin-tazobactam (ZOSYN)  IV  3.375 g Intravenous Q8H  . sodium chloride flush  3 mL Intravenous Q12H  . valproate sodium  500 mg Intravenous Q12H  . [START ON 06/13/2016] vancomycin  500 mg Intravenous Q24H   Continuous Infusions: . dextrose 5 % and 0.45% NaCl 75 mL/hr at 06/11/16 0057  LOS: 2 days      [facility], MD [facility] Pager [phone]  If 7PM-7AM, please contact  night-coverage [email or web address] Password [email or web address] 06/12/2016, 1:34 PM

## 2016-06-13 DIAGNOSIS — Z7189 Other specified counseling: Secondary | ICD-10-CM

## 2016-06-13 DIAGNOSIS — Z515 Encounter for palliative care: Secondary | ICD-10-CM

## 2016-06-13 LAB — GLUCOSE, CAPILLARY
GLUCOSE-CAPILLARY: 148 mg/dL — AB (ref 65–99)
GLUCOSE-CAPILLARY: 171 mg/dL — AB (ref 65–99)
GLUCOSE-CAPILLARY: 155 mg/dL — AB (ref 65–99)
Glucose-Capillary: 106 mg/dL — ABNORMAL HIGH (ref 65–99)
Glucose-Capillary: 124 mg/dL — ABNORMAL HIGH (ref 65–99)

## 2016-06-13 LAB — BASIC METABOLIC PANEL
Anion gap: 7 (ref 5–15)
BUN: 38 mg/dL — ABNORMAL HIGH (ref 6–20)
CALCIUM: 8.7 mg/dL — AB (ref 8.9–10.3)
CO2: 25 mmol/L (ref 22–32)
CREATININE: 1.12 mg/dL — AB (ref 0.44–1.00)
Chloride: 113 mmol/L — ABNORMAL HIGH (ref 101–111)
GFR calc Af Amer: 52 mL/min — ABNORMAL LOW (ref >60)
GFR calc non Af Amer: 45 mL/min — ABNORMAL LOW (ref >60)
GLUCOSE: 124 mg/dL — AB (ref 65–99)
Potassium: 3.5 mmol/L (ref 3.5–5.1)
Sodium: 145 mmol/L (ref 135–145)

## 2016-06-13 MED ORDER — POTASSIUM CHLORIDE 2 MEQ/ML IV SOLN
INTRAVENOUS | Status: DC
  Administered 2016-06-14 (×2): via INTRAVENOUS
  Filled 2016-06-13 (×8): qty 1000

## 2016-06-13 NOTE — Progress Notes (Signed)
No charge.     Family meeting scheduled for 12:00 pm today. Algis DownsMarianne York, New JerseyPA-C Palliative Medicine Pager: [phone]

## 2016-06-13 NOTE — Consult Note (Signed)
Consultation Note Date: 06/13/2016   Patient Name: Faith Wright  DOB: 1933-11-25  MRN: [id number]  Age / Sex: 82 y.o., female  PCP: Lesia Hausen, PA Referring Physician: Modena Jansky, MD  Reason for Consultation: Establishing goals of care  HPI/Patient Profile: 82 y.o. female  with past medical history of dementia, LE contractures, schizophrenia, seizure DO, from SNF who was admitted on 06/10/2016 with sepsis and afib with rvr.  She was found unresponsive at the nursing facility.  She had emergent cardioversion in the ambulance (pulse rate was 250).  Temp on admission was 103F.  A source for the sepsis was undetermined but the patient had a pro calcitonin of  6.16.  She stabilized but developed afib with RVR again on 12/30.  Her troponin as been elevated (1.31).  This was likely worsened by acute on chronic renal failure (baseline creatinine 1.2) with a creatinine of 2.2.  The patient's underlying health status is not good.  She has advanced dementia and is immobile.  Her albumin is 1.9.   Clinical Assessment and Goals of Care: Faith Wright has two sons.  Her local son Marchia Bond) has significant dysphagia.  His daughter Dalene Seltzer cares for him.  They live 1.5 hours from [facility] and consequently are unable to visit Faith Wright frequently.   I met with Dalene Seltzer and Marchia Bond at bedside.  I introduced Palliative Medicine is specialized medical care for people living with serious illness. It focuses on providing relief from the symptoms and stress of a serious illness. The goal is to improve quality of life for both the patient and the family.    We discussed the trajectory of dementia with the natural inability to intake PO nutrition.  We discussed the Afib RVR and what will happen if she is unable or unwilling to accept pills orally - the strain it will put on her heart.  Family completed a MOST form: 1. DNR 2.  Comfort Measures Only 3.  Antibiotics and IV fluids at SNF for comfort 4.  Please engage HOSPICE Services at SNF   Primary Decision Maker:  HCPOA - Son Barkley Boards - Dtr Elson Clan speaks for him as he has dysphagia.    SUMMARY OF RECOMMENDATIONS    1. DNR 2. Comfort Measures Only - return to the hospital only if she can not be comfortable at SNF 3.  Antibiotics and IV fluids at SNF for comfort 4.  Please engage HOSPICE Services at SNF    Symptom Management:         Per Primary team   Prognosis:   Less than 6 months due to advanced dementia and lack of desire for PO intake  Discharge Planning: To SNF with Hospice to follow      Primary Diagnoses: Present on Admission: . Sepsis ([place]) . Dehydration with hypernatremia . Schizophrenia ([place]) . Acute kidney injury ([place]) . PAF (paroxysmal atrial fibrillation) ([place]) . Transaminitis . Constipation . Dementia   I have reviewed the medical record, interviewed the patient and family, and examined the  patient. The following aspects are pertinent.  Past Medical History:  Diagnosis Date  . Dementia   . Diabetes mellitus without complication (HCC)   . DVT (deep vein thrombosis) in pregnancy (HCC)   . Hypertension   . Schizophrenia (HCC)    Social History   Social History  . Marital status: Single    Spouse name: N/A  . Number of children: N/A  . Years of education: N/A   Social History Main Topics  . Smoking status: Never Smoker  . Smokeless tobacco: Never Used  . Alcohol use No  . Drug use: No  . Sexual activity: Not Asked   Other Topics Concern  . None   Social History Narrative  . None   History reviewed. No pertinent family history. Scheduled Meds: . chlorhexidine  15 mL Mouth Rinse BID  . enoxaparin (LOVENOX) injection  30 mg Subcutaneous Q24H  . insulin aspart  0-9 Units Subcutaneous Q4H  . mouth rinse  15 mL Mouth Rinse q12n4p  . metoprolol  5 mg Intravenous Q6H  . piperacillin-tazobactam  (ZOSYN)  IV  3.375 g Intravenous Q8H  . sodium chloride flush  3 mL Intravenous Q12H  . valproate sodium  500 mg Intravenous Q12H   Continuous Infusions: PRN Meds:.acetaminophen **OR** acetaminophen, bisacodyl, metoprolol, sodium phosphate No Known Allergies Review of Systems Patient demented and unable.  Physical Exam  Elderly, pleasantly demented female.  Lying in bed.  Smiling Head AT/[place], dry lips. CV rrr with murmur Resp NAD, cta Abdomen thin, nt, nd Ext.  LE Contracted. No frank edema.  Able to move UE Skin no rash, bruises, lesions.  = RN notes she has a skin tear in her genitalia and a reddened area on her sacrum Psych:  Pleasant, demented, calm, appropriate. Neuro unable to follow commands, awake, alert, not orientated to place or time. Vital Signs: BP (!) 160/79 (BP Location: Left Arm)   Pulse 63   Temp 97.6 F (36.4 C) (Oral)   Resp 17   Wt 49.7 kg (109 lb 8 oz)   SpO2 100%  Pain Assessment: No/denies pain     SpO2: SpO2: 100 % O2 Device:SpO2: 100 % O2 Flow Rate: .O2 Flow Rate (L/min): 2 L/min  IO: Intake/output summary:   Intake/Output Summary (Last 24 hours) at 06/13/16 1140 Last data filed at 06/13/16 0636  Gross per 24 hour  Intake          2037.08 ml  Output              450 ml  Net          1587.08 ml    LBM: Last BM Date: 06/11/16 Baseline Weight: Weight: 52.2 kg (115 lb) Most recent weight: Weight: 49.7 kg (109 lb 8 oz)     Palliative Assessment/Data:   Flowsheet Rows   Flowsheet Row Most Recent Value  Intake Tab  Referral Department  Hospitalist  Unit at Time of Referral  Cardiac/Telemetry Unit  Date Notified  06/12/16  Palliative Care Type  New Palliative care  Reason for referral  Clarify Goals of Care  Date of Admission  06/10/16  Date first seen by Palliative Care  06/13/16  # of days Palliative referral response time  1 Day(s)  # of days IP prior to Palliative referral  2  Clinical Assessment  Palliative Performance Scale Score   20%  Psychosocial & Spiritual Assessment  Palliative Care Outcomes  Patient/Family meeting held?  Yes  Who was at the meeting?    grand dtr, son, patient  Palliative Care Outcomes  Clarified goals of care      Time In: 11:30 Time Out::12:40 Time Total: 70 min Greater than 50%  of this time was spent counseling and coordinating care related to the above assessment and plan.  Signed by:  York, PA-C Palliative Medicine Pager: [phone]  Please contact Palliative Medicine Team phone at [phone] for questions and concerns.  For individual provider: See Amion               

## 2016-06-13 NOTE — Progress Notes (Signed)
PROGRESS NOTE  Faith Wright  RWE:[id number] DOB: 11/03/1933  DOA: 06/10/2016 PCP: Lesia Hausen, PA   Brief Narrative:  82 year old female with PMH of remote DVT, DM 2, dementia, seizure disorder, schizophrenia, 3 ER visits since November (11/13: Generalized weakness, 12/19: Hip pain after fall, 12/22: Knee pain after fall), no significant injuries found on these evaluations and patient was returned to SNF, presented to [facility] ED on 06/10/16 after she was apparently found unresponsive at the SNF and upon EMS arrival heart rate of 250, underwent emergent cardioversion with 200 J with subsequent conversion to sinus rhythm/sinus tachycardia and was noted to be febrile 103F. Admitted for sepsis of unclear etiology, acute kidney injury and paroxysmal A. fib. Palliative care consulted and plans to meet with family on 06/13/16 for goals of care.   Assessment & Plan:   Principal Problem:   Sepsis ([place]) Active Problems:   Dehydration with hypernatremia   Seizure disorder (HCC)   Schizophrenia (HCC)   Diabetes mellitus type 2 in nonobese (HCC)   Acute kidney injury ([place])   PAF (paroxysmal atrial fibrillation) (HCC)   Transaminitis   Constipation   Dementia   Protein-calorie malnutrition, severe   1. Sepsis: Patient met sepsis criteria on admission. Etiology unclear. Treating empirically with IV Zosyn and vancomycin. Lactate has normalized. Pro-calcitonin elevated at 6.16. Blood cultures 2: Negative to date. Urine culture: Negative. RSV panel: Negative. Sepsis physiology resolved. Repeat chest x-ray without active disease.? All nonspecific viral illness. Has defervesced and without fever since 12/28. Discontinue vancomycin 12/30. Since cultures negative to date and no clear source of infection, clinically improved with fevers defervesced, normal WBC, discontinue all antibiotics 12/31 and monitoring clinically. 2. Acute kidney injury: Baseline creatinine in the 1.2 range. Presented with creatinine of 2.2.  Suspect multifactorial secondary to dehydration and sepsis physiology. Resolved after IV fluids. 3. Paroxysmal A. fib with RVR: As per review of EMS telemetry strips by admitting team, most consistent with PAF. Required emergent cardioversion in the field. Remains in sinus rhythm. Likely precipitated by acute illness/sepsis physiology. chadvasc=4 but not a candidate for anticoagulation due to advanced age, advanced dementia/schizophrenia and high risk for bleeding complications. 2-D echo shows LVEF 60-65 percent and grade 1 diastolic dysfunction. Again rapid A. fib in the 130s-140s on 12/30 morning. Reverted to sinus rhythm after metoprolol 5 mg IV every 5 minutes 2 doses. Started on scheduled IV metoprolol 5 MG every 6 hours. Although speech therapy has recommended diet, as per RN, patient not taking orally consistently and hence continue IV metoprolol for now. 4. Dehydration with hypernatremia: Continue IV fluids and follow clinically. Patient nothing by mouth due to risk for aspiration pending speech therapy evaluation. Change IV fluids to D5 infusion. Improved. Hypernatremia resolved. At risk for recurrent dehydration from poor oral intake. 5. Seizure disorder: Patient was found unresponsive and hypotensive. Unable to tell if her initial presentation was postictal. Mild rhabdomyolysis-improving. Continue IV Depakote. 6. Type II DM: Holding metformin due to acute kidney injury. SSI. A1c: 6.2 7. Macrocytic anemia: Stable. 8. Elevated troponin: May be due to demand ischemia from rapid A. fib, hemodynamic instability and sepsis physiology. 2-D echo has normal EF without wall motion abnormalities. Will not be a candidate for any aggressive intervention. 9. Dementia/schizophrenia: Baseline mental status not clear. Mental status at baseline as per discussion with granddaughter on 12/30. 10. Adult failure to thrive: Patient appears chronically ill, seems to have lower extremity contractures and may have  advanced dementia. Palliative care has been consulted and planned  to meet with family on 12/31 for goals of care.  11. Constipation: Bowel regimen 12. Hypokalemia: Replace IV and follow. Magnesium 2.3. Improved.   DVT prophylaxis: Lovenox  Code Status: DO NOT RESUSCITATE  Family Communication: Discussed with granddaughter and updated care and answered questions on 12/30. Disposition Plan: To be determined  Consultants:   Palliative care team   Procedures:   2-D echo: Study Conclusions  - Left ventricle: The cavity size was normal. Wall thickness was   increased in a pattern of severe LVH. Systolic function was   normal. The estimated ejection fraction was in the range of 60%   to 65%. Wall motion was normal; there were no regional wall   motion abnormalities. Doppler parameters are consistent with   abnormal left ventricular relaxation (grade 1 diastolic   dysfunction). LV mid-cavity gradient, peak 27 mmHg. - Aortic valve: There was mild stenosis. There was trivial   regurgitation. Mean gradient (S): 11 mm Hg. Valve area (VTI):   1.55 cm^2. - Mitral valve: Severely calcified annulus. Severely calcified   leaflets . The findings are consistent with moderate stenosis.   There was no significant regurgitation. Mean gradient (D): 9 mm   Hg. - Left atrium: The atrium was moderately to severely dilated. - Right ventricle: The cavity size was normal. Systolic function   was normal. - Tricuspid valve: Peak RV-RA gradient (S): 24 mm Hg. - Pulmonary arteries: PA peak pressure: 32 mm Hg (S). - Systemic veins: IVC measured 1.4 cm with < 50% respirophasic   variation, suggesting RA pressure 8 mmHg.  Impressions:  - Normal LV size with severe LV hypertrophy. EF 60-65%. LV   mid-cavity gradient, peak 27 mmHg. Normal RV size and systolic   function. Mild aortic stenosis. At least moderate mitral   stenosis.  Antimicrobials:   IV vancomycin and Zosyn    Subjective: Patient is  alert, oriented to self. Otherwise unable to understand her speech. As per RN, by mouth intake is not consistent.   Objective:  Vitals:   06/13/16 0059 06/13/16 0529 06/13/16 1210 06/13/16 1428  BP: (!) 152/67 (!) 160/79 (!) 159/51 (!) 164/74  Pulse:  63 63 70  Resp:  17  18  Temp:  97.6 F (36.4 C)  98.5 F (36.9 C)  TempSrc:  Oral  Oral  SpO2:  100%  100%  Weight:  49.7 kg (109 lb 8 oz)      Intake/Output Summary (Last 24 hours) at 06/13/16 1436 Last data filed at 06/13/16 0636  Gross per 24 hour  Intake          2037.08 ml  Output              450 ml  Net          1587.08 ml   Filed Weights   06/11/16 0500 06/12/16 0501 06/13/16 0529  Weight: 51.5 kg (113 lb 8 oz) 49.6 kg (109 lb 4.8 oz) 49.7 kg (109 lb 8 oz)    Examination:  General exam: Elderly female, frail, chronically ill looking, lying comfortably propped up in bed, fidgety, not in obvious distress.  Respiratory system: Poor inspiratory effort, diminished breath sounds in the bases but otherwise clear to auscultation. Respiratory effort normal. Cardiovascular system: S1 & S2 heard, RRR. No JVD, murmurs, rubs, gallops or clicks. No pedal edema.Telemetry: SB in the 50s-SR. Gastrointestinal system: Abdomen is nondistended, soft and nontender. No organomegaly or masses felt. Normal bowel sounds heard.Difficult to examine because of crouched position  from possible contractures of her lower extremities.  Central nervous system: Alert and oriented Only to self. No focal neurological deficits. Extremities: Moves upper extremities symmetrically. Not much movement of her legs and seems to have contractures.  Skin: No rashes, lesions or ulcers Psychiatry: Judgement and insight impaired. Mood & affect appropriate.     Data Reviewed: I have personally reviewed following labs and imaging studies  CBC:  Recent Labs Lab 06/10/16 0900 06/10/16 1014 06/11/16 0324 06/12/16 0609  WBC 19.0* 10.7* 10.6* 9.3  NEUTROABS 14.0*   --   --   --   HGB 4.1* 9.2* 8.7* 9.7*  HCT 12.6* 29.0* 27.1* 30.1*  MCV 101.6* 102.5* 102.3* 101.3*  PLT 455* 264 250 569   Basic Metabolic Panel:  Recent Labs Lab 06/10/16 0900 06/10/16 1939 06/11/16 0324 06/12/16 0609 06/13/16 0811  NA 150*  --  150* 150* 145  K 4.4  --  3.5 3.1* 3.5  CL 107  --  115* 115* 113*  CO2 26  --  24 27 25   GLUCOSE 194*  --  142* 160* 124*  BUN 73*  --  69* 54* 38*  CREATININE 2.20*  --  1.81* 1.40* 1.12*  CALCIUM 9.9  --  8.5* 8.6* 8.7*  MG  --  2.4  --  2.3  --   PHOS  --  4.3  --   --   --    GFR: CrCl cannot be calculated (Unknown ideal weight.). Liver Function Tests:  Recent Labs Lab 06/10/16 0900 06/11/16 0324  AST 58* 59*  ALT 25 23  ALKPHOS 53 40  BILITOT 0.9 0.7  PROT 7.7 5.4*  ALBUMIN 2.5* 1.9*   No results for input(s): LIPASE, AMYLASE in the last 168 hours. No results for input(s): AMMONIA in the last 168 hours. Coagulation Profile:  Recent Labs Lab 06/10/16 1939  INR 1.16   Cardiac Enzymes:  Recent Labs Lab 06/10/16 1939 06/10/16 2045 06/11/16 0324 06/12/16 0609  CKTOTAL 2,236*  --   --  1,553*  TROPONINI 1.24* 1.31* 1.17*  --    BNP (last 3 results) No results for input(s): PROBNP in the last 8760 hours. HbA1C:  Recent Labs  06/10/16 1939  HGBA1C 6.2*   CBG:  Recent Labs Lab 06/12/16 1707 06/12/16 2016 06/13/16 0037 06/13/16 0401 06/13/16 1142  GLUCAP 171* 169* 124* 106* 148*   Lipid Profile: No results for input(s): CHOL, HDL, LDLCALC, TRIG, CHOLHDL, LDLDIRECT in the last 72 hours. Thyroid Function Tests: No results for input(s): TSH, T4TOTAL, FREET4, T3FREE, THYROIDAB in the last 72 hours. Anemia Panel: No results for input(s): VITAMINB12, FOLATE, FERRITIN, TIBC, IRON, RETICCTPCT in the last 72 hours.  Sepsis Labs:  Recent Labs Lab 06/10/16 0914 06/10/16 1322 06/10/16 1939 06/12/16 0609  PROCALCITON  --   --  6.16 3.15  LATICACIDVEN 4.76* 0.98  --   --     Recent Results  (from the past 240 hour(s))  Blood Culture (routine x 2)     Status: None (Preliminary result)   Collection Time: 06/10/16  9:00 AM  Result Value Ref Range Status   Specimen Description BLOOD RIGHT ARM UPPER  Final   Special Requests BOTTLES DRAWN AEROBIC AND ANAEROBIC  5CC  Final   Culture NO GROWTH 2 DAYS  Final   Report Status PENDING  Incomplete  Blood Culture (routine x 2)     Status: None (Preliminary result)   Collection Time: 06/10/16  9:19 AM  Result Value Ref Range  Status   Specimen Description BLOOD RIGHT WRIST  Final   Special Requests BOTTLES DRAWN AEROBIC ONLY  3CC  Final   Culture NO GROWTH 2 DAYS  Final   Report Status PENDING  Incomplete  Urine culture     Status: None   Collection Time: 06/10/16 11:10 AM  Result Value Ref Range Status   Specimen Description URINE, RANDOM  Final   Special Requests NONE  Final   Culture NO GROWTH  Final   Report Status 06/11/2016 FINAL  Final  Respiratory Panel by PCR     Status: None   Collection Time: 06/10/16 10:21 PM  Result Value Ref Range Status   Adenovirus NOT DETECTED NOT DETECTED Final   Coronavirus 229E NOT DETECTED NOT DETECTED Final   Coronavirus HKU1 NOT DETECTED NOT DETECTED Final   Coronavirus NL63 NOT DETECTED NOT DETECTED Final   Coronavirus OC43 NOT DETECTED NOT DETECTED Final   Metapneumovirus NOT DETECTED NOT DETECTED Final   Rhinovirus / Enterovirus NOT DETECTED NOT DETECTED Final   Influenza A NOT DETECTED NOT DETECTED Final   Influenza B NOT DETECTED NOT DETECTED Final   Parainfluenza Virus 1 NOT DETECTED NOT DETECTED Final   Parainfluenza Virus 2 NOT DETECTED NOT DETECTED Final   Parainfluenza Virus 3 NOT DETECTED NOT DETECTED Final   Parainfluenza Virus 4 NOT DETECTED NOT DETECTED Final   Respiratory Syncytial Virus NOT DETECTED NOT DETECTED Final   Bordetella pertussis NOT DETECTED NOT DETECTED Final   Chlamydophila pneumoniae NOT DETECTED NOT DETECTED Final   Mycoplasma pneumoniae NOT DETECTED NOT  DETECTED Final  MRSA PCR Screening     Status: None   Collection Time: 06/11/16  6:01 AM  Result Value Ref Range Status   MRSA by PCR NEGATIVE NEGATIVE Final    Comment:        The GeneXpert MRSA Assay (FDA approved for NASAL specimens only), is one component of a comprehensive MRSA colonization surveillance program. It is not intended to diagnose MRSA infection nor to guide or monitor treatment for MRSA infections.          Radiology Studies: No results found.      Scheduled Meds: . chlorhexidine  15 mL Mouth Rinse BID  . enoxaparin (LOVENOX) injection  30 mg Subcutaneous Q24H  . insulin aspart  0-9 Units Subcutaneous Q4H  . mouth rinse  15 mL Mouth Rinse q12n4p  . metoprolol  5 mg Intravenous Q6H  . piperacillin-tazobactam (ZOSYN)  IV  3.375 g Intravenous Q8H  . sodium chloride flush  3 mL Intravenous Q12H  . valproate sodium  500 mg Intravenous Q12H   Continuous Infusions:    LOS: 3 days      Aliyanna Wassmer, MD [facility] Pager [phone]  If 7PM-7AM, please contact night-coverage [email or web address] Password [email or web address] 06/13/2016, 2:36 PM

## 2016-06-14 DIAGNOSIS — F0390 Unspecified dementia without behavioral disturbance: Secondary | ICD-10-CM

## 2016-06-14 DIAGNOSIS — Z7189 Other specified counseling: Secondary | ICD-10-CM

## 2016-06-14 DIAGNOSIS — Z515 Encounter for palliative care: Secondary | ICD-10-CM

## 2016-06-14 LAB — GLUCOSE, CAPILLARY
GLUCOSE-CAPILLARY: 111 mg/dL — AB (ref 65–99)
GLUCOSE-CAPILLARY: 161 mg/dL — AB (ref 65–99)
GLUCOSE-CAPILLARY: 175 mg/dL — AB (ref 65–99)
GLUCOSE-CAPILLARY: 141 mg/dL — AB (ref 65–99)
Glucose-Capillary: 118 mg/dL — ABNORMAL HIGH (ref 65–99)
Glucose-Capillary: 203 mg/dL — ABNORMAL HIGH (ref 65–99)
Glucose-Capillary: 79 mg/dL (ref 65–99)

## 2016-06-14 LAB — PROCALCITONIN: PROCALCITONIN: 1.24 ng/mL

## 2016-06-14 NOTE — Progress Notes (Signed)
PROGRESS NOTE  Faith Wright  ZOX:[id number] DOB: October 05, 1933  DOA: 06/10/2016 PCP: Lesia Hausen, PA   Brief Narrative:  83 year old female with PMH of remote DVT, DM 2, dementia, seizure disorder, schizophrenia, 3 ER visits since November (11/13: Generalized weakness, 12/19: Hip pain after fall, 12/22: Knee pain after fall), no significant injuries found on these evaluations and patient was returned to SNF, presented to [facility] ED on 06/10/16 after she was apparently found unresponsive at the SNF and upon EMS arrival heart rate of 250, underwent emergent cardioversion with 200 J with subsequent conversion to sinus rhythm/sinus tachycardia and was noted to be febrile 103F. Admitted for sepsis of unclear etiology, acute kidney injury and paroxysmal A. fib. Palliative care consulted and met with family on 06/13/16 for goals of care. As per nursing, no by mouth intake and hence unable to switch IV metoprolol to by mouth either. Discussed with palliative care MD and team will reassess in a.m. to determine if patient is appropriate for residential hospice rather than SNF with hospice as per their prior recommendations.   Assessment & Plan:   Principal Problem:   Sepsis ([place]) Active Problems:   Dehydration with hypernatremia   Seizure disorder (HCC)   Schizophrenia (HCC)   Diabetes mellitus type 2 in nonobese (HCC)   Acute kidney injury ([place])   PAF (paroxysmal atrial fibrillation) (HCC)   Transaminitis   Constipation   Dementia   Protein-calorie malnutrition, severe   Palliative care encounter   Encounter for hospice care discussion   Goals of care, counseling/discussion   1. Sepsis: Patient met sepsis criteria on admission. Etiology unclear. Treating empirically with IV Zosyn and vancomycin. Lactate has normalized. Pro-calcitonin elevated at 6.16. Blood cultures 2: Negative to date. Urine culture: Negative. RSV panel: Negative. Sepsis physiology resolved. Repeat chest x-ray without active disease.?  All nonspecific viral illness. Has defervesced and without fever since 12/28. Discontinue vancomycin 12/30. Since cultures negative to date and no clear source of infection, clinically improved with fevers defervesced, normal WBC, discontinued all antibiotics 12/31 and monitoring clinically. 2. Acute kidney injury: Baseline creatinine in the 1.2 range. Presented with creatinine of 2.2. Suspect multifactorial secondary to dehydration and sepsis physiology. Resolved after IV fluids. At risk for recurrent acute kidney injury related to poor oral intake. 3. Paroxysmal A. fib with RVR: As per review of EMS telemetry strips by admitting team, most consistent with PAF. Required emergent cardioversion in the field. Remains in sinus rhythm. Likely precipitated by acute illness/sepsis physiology. chadvasc=4 but not a candidate for anticoagulation due to advanced age, advanced dementia/schizophrenia and high risk for bleeding complications. 2-D echo shows LVEF 60-65 percent and grade 1 diastolic dysfunction. Again rapid A. fib in the 130s-140s on 12/30 morning. Reverted to sinus rhythm after metoprolol 5 mg IV every 5 minutes 2 doses. Started on scheduled IV metoprolol 5 MG every 6 hours. Although speech therapy has recommended diet, as per RN, patient not taking PO and hence continue IV metoprolol for now. Palliative care team will reevaluate on 1/2 regarding appropriateness for residential hospice rather than SNF with hospice as per their earlier recommendation. 4. Dehydration with hypernatremia: Changed IV fluids to D5 infusion. Improved. Hypernatremia resolved. At risk for recurrent dehydration from poor oral intake. Although diet ordered by speech therapy, patient refusing orals. 5. Seizure disorder: Patient was found unresponsive and hypotensive. Unable to tell if her initial presentation was postictal. Mild rhabdomyolysis-improving. Continue IV Depakote. 6. Type II DM: Holding metformin due to acute kidney injury.  SSI.  A1c: 6.2 7. Macrocytic anemia: Stable. 8. Elevated troponin: May be due to demand ischemia from rapid A. fib, hemodynamic instability and sepsis physiology. 2-D echo has normal EF without wall motion abnormalities. Will not be a candidate for any aggressive intervention. 9. Dementia/schizophrenia: Baseline mental status not clear. Mental status at baseline as per discussion with granddaughter on 12/30. 10. Adult failure to thrive: Patient appears chronically ill, seems to have lower extremity contractures and may have advanced dementia. Palliative care input appreciated and recommended DO NOT RESUSCITATE, comfort measures only, antibiotics and IV fluids at SNF for comfort and hospice services at SNF. Discussed with Dr. Rowe Pavy: Advised her that patient is refusing oral intake. Palliative care team will reassess patient on 06/15/16 to determine if patient is appropriate for residential hospice..  11. Constipation: Bowel regimen 12. Hypokalemia: Replace IV and follow. Magnesium 2.3. Improved.   DVT prophylaxis: Lovenox  Code Status: DO NOT RESUSCITATE  Family Communication:  Disposition Plan: To be determined  Consultants:   Palliative care team   Procedures:   2-D echo: Study Conclusions  - Left ventricle: The cavity size was normal. Wall thickness was   increased in a pattern of severe LVH. Systolic function was   normal. The estimated ejection fraction was in the range of 60%   to 65%. Wall motion was normal; there were no regional wall   motion abnormalities. Doppler parameters are consistent with   abnormal left ventricular relaxation (grade 1 diastolic   dysfunction). LV mid-cavity gradient, peak 27 mmHg. - Aortic valve: There was mild stenosis. There was trivial   regurgitation. Mean gradient (S): 11 mm Hg. Valve area (VTI):   1.55 cm^2. - Mitral valve: Severely calcified annulus. Severely calcified   leaflets . The findings are consistent with moderate stenosis.   There was  no significant regurgitation. Mean gradient (D): 9 mm   Hg. - Left atrium: The atrium was moderately to severely dilated. - Right ventricle: The cavity size was normal. Systolic function   was normal. - Tricuspid valve: Peak RV-RA gradient (S): 24 mm Hg. - Pulmonary arteries: PA peak pressure: 32 mm Hg (S). - Systemic veins: IVC measured 1.4 cm with < 50% respirophasic   variation, suggesting RA pressure 8 mmHg.  Impressions:  - Normal LV size with severe LV hypertrophy. EF 60-65%. LV   mid-cavity gradient, peak 27 mmHg. Normal RV size and systolic   function. Mild aortic stenosis. At least moderate mitral   stenosis.  Antimicrobials:   IV vancomycin and Zosyn    Subjective: Patient is alert, oriented to self. Otherwise unable to understand her speech. As per RN, patient is refusing oral intake.  Objective:  Vitals:   06/13/16 2105 06/14/16 0453 06/14/16 0455 06/14/16 1238  BP: (!) 154/56 (!) 149/83  (!) 195/91  Pulse: 61 70  76  Resp: 16 18    Temp: 97.7 F (36.5 C) 97.8 F (36.6 C)    TempSrc: Oral Oral    SpO2: 100% 100%    Weight:   52.5 kg (115 lb 12.8 oz)     Intake/Output Summary (Last 24 hours) at 06/14/16 1459 Last data filed at 06/13/16 1700  Gross per 24 hour  Intake           134.17 ml  Output                0 ml  Net           134.17 ml   [employer]  06/12/16 0501 06/13/16 0529 06/14/16 0455  Weight: 49.6 kg (109 lb 4.8 oz) 49.7 kg (109 lb 8 oz) 52.5 kg (115 lb 12.8 oz)    Examination:  General exam: Elderly female, frail, chronically ill looking, lying comfortably propped up in bed, not in obvious distress.  Respiratory system: Poor inspiratory effort, diminished breath sounds in the bases but otherwise clear to auscultation. Respiratory effort normal. Cardiovascular system: S1 & S2 heard, RRR. No JVD, murmurs, rubs, gallops or clicks. No pedal edema.Telemetry: SB in the 50s-SR. Gastrointestinal system: Abdomen is nondistended, soft and  nontender. No organomegaly or masses felt. Normal bowel sounds heard.Difficult to examine because of crouched position from possible contractures of her lower extremities.  Central nervous system: Alert and oriented only to self. No focal neurological deficits. Extremities: Moves upper extremities symmetrically. Not much movement of her legs and seems to have contractures.  Skin: No rashes, lesions or ulcers Psychiatry: Judgement and insight impaired. Mood & affect appropriate.     Data Reviewed: I have personally reviewed following labs and imaging studies  CBC:  Recent Labs Lab 06/10/16 0900 06/10/16 1014 06/11/16 0324 06/12/16 0609  WBC 19.0* 10.7* 10.6* 9.3  NEUTROABS 14.0*  --   --   --   HGB 4.1* 9.2* 8.7* 9.7*  HCT 12.6* 29.0* 27.1* 30.1*  MCV 101.6* 102.5* 102.3* 101.3*  PLT 455* 264 250 465   Basic Metabolic Panel:  Recent Labs Lab 06/10/16 0900 06/10/16 1939 06/11/16 0324 06/12/16 0609 06/13/16 0811  NA 150*  --  150* 150* 145  K 4.4  --  3.5 3.1* 3.5  CL 107  --  115* 115* 113*  CO2 26  --  '24 27 25  ' GLUCOSE 194*  --  142* 160* 124*  BUN 73*  --  69* 54* 38*  CREATININE 2.20*  --  1.81* 1.40* 1.12*  CALCIUM 9.9  --  8.5* 8.6* 8.7*  MG  --  2.4  --  2.3  --   PHOS  --  4.3  --   --   --    GFR: CrCl cannot be calculated (Unknown ideal weight.). Liver Function Tests:  Recent Labs Lab 06/10/16 0900 06/11/16 0324  AST 58* 59*  ALT 25 23  ALKPHOS 53 40  BILITOT 0.9 0.7  PROT 7.7 5.4*  ALBUMIN 2.5* 1.9*   No results for input(s): LIPASE, AMYLASE in the last 168 hours. No results for input(s): AMMONIA in the last 168 hours. Coagulation Profile:  Recent Labs Lab 06/10/16 1939  INR 1.16   Cardiac Enzymes:  Recent Labs Lab 06/10/16 1939 06/10/16 2045 06/11/16 0324 06/12/16 0609  CKTOTAL 2,236*  --   --  1,553*  TROPONINI 1.24* 1.31* 1.17*  --    BNP (last 3 results) No results for input(s): PROBNP in the last 8760 hours. HbA1C: No  results for input(s): HGBA1C in the last 72 hours. CBG:  Recent Labs Lab 06/13/16 2102 06/14/16 0021 06/14/16 0338 06/14/16 0741 06/14/16 1150  GLUCAP 155* 79 111* 161* 203*   Lipid Profile: No results for input(s): CHOL, HDL, LDLCALC, TRIG, CHOLHDL, LDLDIRECT in the last 72 hours. Thyroid Function Tests: No results for input(s): TSH, T4TOTAL, FREET4, T3FREE, THYROIDAB in the last 72 hours. Anemia Panel: No results for input(s): VITAMINB12, FOLATE, FERRITIN, TIBC, IRON, RETICCTPCT in the last 72 hours.  Sepsis Labs:  Recent Labs Lab 06/10/16 0914 06/10/16 1322 06/10/16 1939 06/12/16 0609 06/14/16 0403  PROCALCITON  --   --  6.16 3.15  1.24  LATICACIDVEN 4.76* 0.98  --   --   --     Recent Results (from the past 240 hour(s))  Blood Culture (routine x 2)     Status: None (Preliminary result)   Collection Time: 06/10/16  9:00 AM  Result Value Ref Range Status   Specimen Description BLOOD RIGHT ARM UPPER  Final   Special Requests BOTTLES DRAWN AEROBIC AND ANAEROBIC  5CC  Final   Culture NO GROWTH 4 DAYS  Final   Report Status PENDING  Incomplete  Blood Culture (routine x 2)     Status: None (Preliminary result)   Collection Time: 06/10/16  9:19 AM  Result Value Ref Range Status   Specimen Description BLOOD RIGHT WRIST  Final   Special Requests BOTTLES DRAWN AEROBIC ONLY  3CC  Final   Culture NO GROWTH 4 DAYS  Final   Report Status PENDING  Incomplete  Urine culture     Status: None   Collection Time: 06/10/16 11:10 AM  Result Value Ref Range Status   Specimen Description URINE, RANDOM  Final   Special Requests NONE  Final   Culture NO GROWTH  Final   Report Status 06/11/2016 FINAL  Final  Respiratory Panel by PCR     Status: None   Collection Time: 06/10/16 10:21 PM  Result Value Ref Range Status   Adenovirus NOT DETECTED NOT DETECTED Final   Coronavirus 229E NOT DETECTED NOT DETECTED Final   Coronavirus HKU1 NOT DETECTED NOT DETECTED Final   Coronavirus NL63  NOT DETECTED NOT DETECTED Final   Coronavirus OC43 NOT DETECTED NOT DETECTED Final   Metapneumovirus NOT DETECTED NOT DETECTED Final   Rhinovirus / Enterovirus NOT DETECTED NOT DETECTED Final   Influenza A NOT DETECTED NOT DETECTED Final   Influenza B NOT DETECTED NOT DETECTED Final   Parainfluenza Virus 1 NOT DETECTED NOT DETECTED Final   Parainfluenza Virus 2 NOT DETECTED NOT DETECTED Final   Parainfluenza Virus 3 NOT DETECTED NOT DETECTED Final   Parainfluenza Virus 4 NOT DETECTED NOT DETECTED Final   Respiratory Syncytial Virus NOT DETECTED NOT DETECTED Final   Bordetella pertussis NOT DETECTED NOT DETECTED Final   Chlamydophila pneumoniae NOT DETECTED NOT DETECTED Final   Mycoplasma pneumoniae NOT DETECTED NOT DETECTED Final  MRSA PCR Screening     Status: None   Collection Time: 06/11/16  6:01 AM  Result Value Ref Range Status   MRSA by PCR NEGATIVE NEGATIVE Final    Comment:        The GeneXpert MRSA Assay (FDA approved for NASAL specimens only), is one component of a comprehensive MRSA colonization surveillance program. It is not intended to diagnose MRSA infection nor to guide or monitor treatment for MRSA infections.          Radiology Studies: No results found.      Scheduled Meds: . chlorhexidine  15 mL Mouth Rinse BID  . enoxaparin (LOVENOX) injection  30 mg Subcutaneous Q24H  . insulin aspart  0-9 Units Subcutaneous Q4H  . mouth rinse  15 mL Mouth Rinse q12n4p  . metoprolol  5 mg Intravenous Q6H  . sodium chloride flush  3 mL Intravenous Q12H  . valproate sodium  500 mg Intravenous Q12H   Continuous Infusions: . dextrose 5 % 1,000 mL with potassium chloride 40 mEq infusion 125 mL/hr at 06/14/16 0735     LOS: 4 days      [facility], MD [facility] Pager [phone]  If 7PM-7AM, please contact  night-coverage [email or web address] Password [email or web address] 06/14/2016, 2:59 PM

## 2016-06-15 DIAGNOSIS — A419 Sepsis, unspecified organism: Principal | ICD-10-CM

## 2016-06-15 DIAGNOSIS — F0390 Unspecified dementia without behavioral disturbance: Secondary | ICD-10-CM | POA: Insufficient documentation

## 2016-06-15 DIAGNOSIS — R630 Anorexia: Secondary | ICD-10-CM

## 2016-06-15 LAB — CULTURE, BLOOD (ROUTINE X 2)
Culture: NO GROWTH
Culture: NO GROWTH

## 2016-06-15 LAB — GLUCOSE, CAPILLARY
GLUCOSE-CAPILLARY: 92 mg/dL (ref 65–99)
Glucose-Capillary: 146 mg/dL — ABNORMAL HIGH (ref 65–99)
Glucose-Capillary: 150 mg/dL — ABNORMAL HIGH (ref 65–99)
Glucose-Capillary: 168 mg/dL — ABNORMAL HIGH (ref 65–99)
Glucose-Capillary: 189 mg/dL — ABNORMAL HIGH (ref 65–99)
Glucose-Capillary: 96 mg/dL (ref 65–99)

## 2016-06-15 NOTE — Progress Notes (Signed)
Daily Progress Note   Patient Name: Faith Wright       Date: 06/15/2016 DOB: 10/13/1933  Age: 83 y.o. MRN#: [id number] Attending Physician: Rhetta Mura, MD Primary Care Physician: Mortimer Fries, PA Admit Date: 06/10/2016  Reason for Consultation/Follow-up: Establishing goals of care  Subjective: Faith Wright is awake with no family at bedside. She is confused but denies any discomfort or concerns. No family at bedside. RN tells me that she is refusing all intake.   I called and spoke to Faith Wright's granddaughter Faith Wright via telephone as she helps her father (he has problems speaking after an TBI years ago). I acknowledged their previous conversation and desires for IVF and antibiotics. I discussed the current problem is we are unable to d/c IVF or give medications as she is not accepting anything by mouth. Faith Wright expresses that they would like to have her placed closer to [place], [place] where they live. We discussed the option of hospice facility but this would be with comfort and no IVF/antibiotics. Faith Wright says she will discuss this option with her father and brother. I gave her my number for further questions and decisions.   Length of Stay: 5  Current Medications: Scheduled Meds:  . chlorhexidine  15 mL Mouth Rinse BID  . enoxaparin (LOVENOX) injection  30 mg Subcutaneous Q24H  . insulin aspart  0-9 Units Subcutaneous Q4H  . mouth rinse  15 mL Mouth Rinse q12n4p  . metoprolol  5 mg Intravenous Q6H  . sodium chloride flush  3 mL Intravenous Q12H  . valproate sodium  500 mg Intravenous Q12H    Continuous Infusions: . dextrose 5 % 1,000 mL with potassium chloride 40 mEq infusion 50 mL/hr at 06/14/16 1931    PRN Meds: acetaminophen **OR** acetaminophen, bisacodyl, metoprolol, sodium  phosphate  Physical Exam  Constitutional: She appears well-developed.  HENT:  Head: Normocephalic and atraumatic.  Cardiovascular: Normal rate and regular rhythm.   Pulmonary/Chest: Effort normal. No accessory muscle usage. No tachypnea. No respiratory distress.  Abdominal: Normal appearance.  Neurological: She is alert. She is disoriented.  Nursing note and vitals reviewed.           Vital Signs: BP (!) 167/59 (BP Location: Right Arm)   Pulse 61   Temp 97.7 F (36.5 C) (Oral)   Resp 16   Wt  55.3 kg (122 lb)   SpO2 98%  SpO2: SpO2: 98 % O2 Device: O2 Device: Not Delivered O2 Flow Rate: O2 Flow Rate (L/min): 2 L/min  Intake/output summary:  Intake/Output Summary (Last 24 hours) at 06/15/16 1311 Last data filed at 06/15/16 0900  Gross per 24 hour  Intake              100 ml  Output                0 ml  Net              100 ml   LBM: Last BM Date: 06/15/16 Baseline Weight: Weight: 52.2 kg (115 lb) Most recent weight: Weight: 55.3 kg (122 lb)       Palliative Assessment/Data:    Flowsheet Rows   Flowsheet Row Most Recent Value  Intake Tab  Referral Department  Hospitalist  Unit at Time of Referral  Cardiac/Telemetry Unit  Date Notified  06/12/16  Palliative Care Type  New Palliative care  Reason for referral  Clarify Goals of Care  Date of Admission  06/10/16  Date first seen by Palliative Care  06/13/16  # of days Palliative referral response time  1 Day(s)  # of days IP prior to Palliative referral  2  Clinical Assessment  Palliative Performance Scale Score  20%  Psychosocial & Spiritual Assessment  Palliative Care Outcomes  Patient/Family meeting held?  Yes  Who was at the meeting?  grand dtr, son, patient  Palliative Care Outcomes  Clarified goals of care      Patient Active Problem List   Diagnosis Date Noted  . Palliative care encounter   . Encounter for hospice care discussion   . Goals of care, counseling/discussion   . Protein-calorie  malnutrition, severe 06/11/2016  . Sepsis (HCC) 06/10/2016  . Dehydration with hypernatremia 06/10/2016  . Seizure disorder (HCC) 06/10/2016  . Schizophrenia (HCC) 06/10/2016  . Diabetes mellitus type 2 in nonobese (HCC) 06/10/2016  . Acute kidney injury (HCC) 06/10/2016  . PAF (paroxysmal atrial fibrillation) (HCC) 06/10/2016  . Transaminitis 06/10/2016  . Constipation 06/10/2016  . Dementia 06/10/2016  . Altered mental status     Palliative Care Assessment & Plan   HPI: 83 yo female with past medical history of dementia, LE contractures, schizophrenia, seizure DO, from SNF who was admitted on 06/10/2016 with sepsis and afib with rvr.  She was found unresponsive at the nursing facility.  She had emergent cardioversion in the ambulance (pulse rate was 250).  Temp on admission was 103F.  A source for the sepsis was undetermined but the patient had a pro calcitonin of  6.16.  She stabilized but developed afib with RVR again on 12/30.  Her troponin as been elevated (1.31).  This was likely worsened by acute on chronic renal failure (baseline creatinine 1.2) with a creatinine of 2.2.  The patient's underlying health status is poor.  She has advanced dementia and is immobile.  Her albumin is 1.9.   Assessment: Lying in bed, no distress.   Recommendations/Plan:  No current signs/symptoms of discomfort.   Goals of Care and Additional Recommendations:  Limitations on Scope of Treatment: Full Comfort Care  Code Status:  DNR  Prognosis:   < 2 weeks  Discharge Planning:  Recommend hospice facility if family agrees.   Care plan was discussed with Dr. Mahala Menghini.   Thank you for allowing the Palliative Medicine Team to assist in the care of this patient.  Time In: 1215 Time Out: 1230 Total Time 15min Prolonged Time Billed  no       Greater than 50%  of this time was spent counseling and coordinating care related to the above assessment and plan.  Yong ChannelAlicia Damariz Paganelli, NP Palliative  Medicine Team Pager # [phone] (M-F 8a-5p) Team Phone # [phone] (Nights/Weekends)

## 2016-06-15 NOTE — Progress Notes (Signed)
PROGRESS NOTE  Faith Wright  PYK:[id number] DOB: 1933/10/14  DOA: 06/10/2016 PCP: Lesia Hausen, PA   Brief Narrative:  16 ?  DVT,  DM 2,  dementia,  seizure disorder,  schizophrenia,   3 ER visits since November (11/13: Generalized weakness, 12/19: Hip pain after fall, 12/22: Knee pain after fall), no significant injuries found on these evaluations and patient was returned to SNF, presented to [facility] ED on 06/10/16 after she was apparently found unresponsive.  upon EMS arrival heart rate of 250, underwent emergent cardioversion with 200 J with subsequent conversion to sinus rhythm/sinus tachycardia and was noted to be febrile 103F.   Admitted for sepsis of unclear etiology, acute kidney injury and paroxysmal A. fib. Palliative care consulted and met with family on 06/13/16 for goals of care. As per nursing, no by mouth intake and hence unable to switch IV metoprolol to by mouth either. Discussed with palliative care MD and decision making by power of attorney is pending   Assessment & Plan:   Principal Problem:   Sepsis ([place]) Active Problems:   Dehydration with hypernatremia   Seizure disorder (HCC)   Schizophrenia (HCC)   Diabetes mellitus type 2 in nonobese ([place])   Acute kidney injury ([place])   PAF (paroxysmal atrial fibrillation) (HCC)   Transaminitis   Constipation   Dementia   Protein-calorie malnutrition, severe   Palliative care encounter   Encounter for hospice care discussion   Goals of care, counseling/discussion   1. Sepsis: Patient met sepsis criteria on admission. Etiology unclear. Treating empirically with IV Zosyn and vancomycin. Lactate has normalized. Pro-calcitonin elevated at 6.16. Blood cultures 2: Negative to date. Urine culture: Negative. RSV panel: Negative. Sepsis physiology resolved. Repeat chest x-ray without active disease.? All nonspecific viral illness. Has defervesced and without fever since 12/28. Discontinue vancomycin 12/30. Since cultures negative to  date and no clear source of infection, clinically improved with fevers defervesced, normal WBC, discontinued all antibiotics 12/31--seems to have stabilized 2. Acute kidney injury: Baseline creatinine in the 1.2 range. Presented with creatinine of 2.2. Suspect multifactorial secondary to dehydration and sepsis physiology. Resolved after IV fluids. At risk for recurrent acute kidney injury related to poor oral intake. 3. Paroxysmal A. fib with RVR: As per review of EMS telemetry strips by admitting team, most consistent with PAF. Required emergent cardioversion in the field. Remains in sinus rhythm. Likely precipitated by acute illness/sepsis physiology. chadvasc=4 but not a candidate for anticoagulation due to advanced age, advanced dementia/schizophrenia and high risk for bleeding complications. 2-D echo shows LVEF 60-65 percent and grade 1 diastolic dysfunction. Again rapid A. fib in the 130s-140s on 12/30 morning. Reverted to sinus rhythm after metoprolol 5 mg IV every 5 minutes 2 doses. Started on scheduled IV metoprolol 5 MG every 6 hours. Although speech therapy has recommended diet, as per RN, patient not taking PO and hence continue IV metoprolol for now. Palliative care team discussing with family regarding patient's 4. hypovolemic hypernatremia: Changed IV fluids to D5 infusion. Improved. Hypernatremia resolved. At risk for recurrent dehydration from poor oral intake. Repeat labs a.m. 06/16/2016.  Saline lock IVF 06/15/16 to see if patient will take in any by mouth 5. Seizure disorder: Patient was found unresponsive and hypotensive. Unable to tell if her initial presentation was postictal. Mild rhabdomyolysis-improving. Continue IV Depakote. 6. Type II DM: Holding metformin due to acute kidney injury. SSI. A1c: 6.2 7. Macrocytic anemia: Stable. 8. Elevated troponin: May be due to demand ischemia from rapid A. fib, hemodynamic  instability and sepsis physiology. 2-D echo has normal EF without wall  motion abnormalities. Will not be a candidate for any aggressive intervention. 9. Dementia/schizophrenia: Baseline mental status not clear. Mental status at baseline as per discussion with granddaughter on 12/30. 10. Adult failure to thrive: Patient appears chronically ill, seems to have lower extremity contractures and may have advanced dementia. Palliative care input appreciated and recommended DO NOT RESUSCITATE, comfort measures only, antibiotics and IV fluids at SNF for comfort and hospice services at SNF. Discussed with Dr. Rowe Pavy: Advised her that patient is refusing oral intake. Palliative care team input appreciated 11. Constipation: Bowel regimen 12. Hypokalemia: Replace IV and follow. Magnesium 2.3.-recheck with next lab   DVT prophylaxis: Lovenox  Code Status: DO NOT RESUSCITATE  Family Communication: No Family present at bedside Disposition Plan: To be determined  Consultants:   Palliative care team   Procedures:   2-D echo: Study Conclusions  - Left ventricle: The cavity size was normal. Wall thickness was   increased in a pattern of severe LVH. Systolic function was   normal. The estimated ejection fraction was in the range of 60%   to 65%. Wall motion was normal; there were no regional wall   motion abnormalities. Doppler parameters are consistent with   abnormal left ventricular relaxation (grade 1 diastolic   dysfunction). LV mid-cavity gradient, peak 27 mmHg. - Aortic valve: There was mild stenosis. There was trivial   regurgitation. Mean gradient (S): 11 mm Hg. Valve area (VTI):   1.55 cm^2. - Mitral valve: Severely calcified annulus. Severely calcified   leaflets . The findings are consistent with moderate stenosis.   There was no significant regurgitation. Mean gradient (D): 9 mm   Hg. - Left atrium: The atrium was moderately to severely dilated. - Right ventricle: The cavity size was normal. Systolic function   was normal. - Tricuspid valve: Peak RV-RA  gradient (S): 24 mm Hg. - Pulmonary arteries: PA peak pressure: 32 mm Hg (S). - Systemic veins: IVC measured 1.4 cm with < 50% respirophasic   variation, suggesting RA pressure 8 mmHg.  Impressions:  - Normal LV size with severe LV hypertrophy. EF 60-65%. LV   mid-cavity gradient, peak 27 mmHg. Normal RV size and systolic   function. Mild aortic stenosis. At least moderate mitral   stenosis.  Antimicrobials:   IV vancomycin and Zosyn    Subjective: Patient is alert, oriented to self.  She has garbled speech She cannot orient There is no family at the bedside  Objective:  Vitals:   06/14/16 1510 06/14/16 2136 06/15/16 0334 06/15/16 0559  BP: (!) 177/65 (!) 149/87  (!) 167/59  Pulse: 71 62  61  Resp: 16 16  16   Temp: 98.2 F (36.8 C) 97.7 F (36.5 C)  97.7 F (36.5 C)  TempSrc:  Oral  Oral  SpO2: 97% 100%  98%  Weight:   55.3 kg (122 lb)    No intake or output data in the 24 hours ending 06/15/16 0827 Filed Weights   06/13/16 0529 06/14/16 0455 06/15/16 0334  Weight: 49.7 kg (109 lb 8 oz) 52.5 kg (115 lb 12.8 oz) 55.3 kg (122 lb)    Examination:  General exam: Elderly female, frail, chronically ill looking, I temporal wasting supraclavicularly wasting Respiratory system: Poor inspiratory effort, diminished breath sounds in the bases but otherwise clear to auscultation. Respiratory effort normal. Cardiovascular system: S1 & S2 heard, RRR. No JVD, murmurs, rubs, gallops or clicks. No pedal edema.Telemetry: SB in  the 50s-SR. Gastrointestinal system: Abdomen is nondistended, soft and nontender. No organomegaly or masses felt. crouched position from possible contractures of her lower extremities.  Central nervous system: Alert and oriented only to self. No focal neurological deficits. Extremities: Moves upper extremities symmetrically. Not much movement of her legs and seems to have contractures.  Skin: No rashes, lesions or ulcers Psychiatry: Judgement and insight  impaired. Mood & affect appropriate.     Data Reviewed: I have personally reviewed following labs and imaging studies  CBC:  Recent Labs Lab 06/10/16 0900 06/10/16 1014 06/11/16 0324 06/12/16 0609  WBC 19.0* 10.7* 10.6* 9.3  NEUTROABS 14.0*  --   --   --   HGB 4.1* 9.2* 8.7* 9.7*  HCT 12.6* 29.0* 27.1* 30.1*  MCV 101.6* 102.5* 102.3* 101.3*  PLT 455* 264 250 748   Basic Metabolic Panel:  Recent Labs Lab 06/10/16 0900 06/10/16 1939 06/11/16 0324 06/12/16 0609 06/13/16 0811  NA 150*  --  150* 150* 145  K 4.4  --  3.5 3.1* 3.5  CL 107  --  115* 115* 113*  CO2 26  --  24 27 25   GLUCOSE 194*  --  142* 160* 124*  BUN 73*  --  69* 54* 38*  CREATININE 2.20*  --  1.81* 1.40* 1.12*  CALCIUM 9.9  --  8.5* 8.6* 8.7*  MG  --  2.4  --  2.3  --   PHOS  --  4.3  --   --   --    GFR: CrCl cannot be calculated (Unknown ideal weight.). Liver Function Tests:  Recent Labs Lab 06/10/16 0900 06/11/16 0324  AST 58* 59*  ALT 25 23  ALKPHOS 53 40  BILITOT 0.9 0.7  PROT 7.7 5.4*  ALBUMIN 2.5* 1.9*   No results for input(s): LIPASE, AMYLASE in the last 168 hours. No results for input(s): AMMONIA in the last 168 hours. Coagulation Profile:  Recent Labs Lab 06/10/16 1939  INR 1.16   Cardiac Enzymes:  Recent Labs Lab 06/10/16 1939 06/10/16 2045 06/11/16 0324 06/12/16 0609  CKTOTAL 2,236*  --   --  1,553*  TROPONINI 1.24* 1.31* 1.17*  --    BNP (last 3 results) No results for input(s): PROBNP in the last 8760 hours. HbA1C: No results for input(s): HGBA1C in the last 72 hours. CBG:  Recent Labs Lab 06/14/16 1647 06/14/16 2013 06/15/16 0023 06/15/16 0356 06/15/16 0746  GLUCAP 175* 141* 96 92 146*   Lipid Profile: No results for input(s): CHOL, HDL, LDLCALC, TRIG, CHOLHDL, LDLDIRECT in the last 72 hours. Thyroid Function Tests: No results for input(s): TSH, T4TOTAL, FREET4, T3FREE, THYROIDAB in the last 72 hours. Anemia Panel: No results for input(s):  VITAMINB12, FOLATE, FERRITIN, TIBC, IRON, RETICCTPCT in the last 72 hours.  Sepsis Labs:  Recent Labs Lab 06/10/16 0914 06/10/16 1322 06/10/16 1939 06/12/16 0609 06/14/16 0403  PROCALCITON  --   --  6.16 3.15 1.24  LATICACIDVEN 4.76* 0.98  --   --   --     Recent Results (from the past 240 hour(s))  Blood Culture (routine x 2)     Status: None (Preliminary result)   Collection Time: 06/10/16  9:00 AM  Result Value Ref Range Status   Specimen Description BLOOD RIGHT ARM UPPER  Final   Special Requests BOTTLES DRAWN AEROBIC AND ANAEROBIC  5CC  Final   Culture NO GROWTH 4 DAYS  Final   Report Status PENDING  Incomplete  Blood Culture (routine x  2)     Status: None (Preliminary result)   Collection Time: 06/10/16  9:19 AM  Result Value Ref Range Status   Specimen Description BLOOD RIGHT WRIST  Final   Special Requests BOTTLES DRAWN AEROBIC ONLY  3CC  Final   Culture NO GROWTH 4 DAYS  Final   Report Status PENDING  Incomplete  Urine culture     Status: None   Collection Time: 06/10/16 11:10 AM  Result Value Ref Range Status   Specimen Description URINE, RANDOM  Final   Special Requests NONE  Final   Culture NO GROWTH  Final   Report Status 06/11/2016 FINAL  Final  Respiratory Panel by PCR     Status: None   Collection Time: 06/10/16 10:21 PM  Result Value Ref Range Status   Adenovirus NOT DETECTED NOT DETECTED Final   Coronavirus 229E NOT DETECTED NOT DETECTED Final   Coronavirus HKU1 NOT DETECTED NOT DETECTED Final   Coronavirus NL63 NOT DETECTED NOT DETECTED Final   Coronavirus OC43 NOT DETECTED NOT DETECTED Final   Metapneumovirus NOT DETECTED NOT DETECTED Final   Rhinovirus / Enterovirus NOT DETECTED NOT DETECTED Final   Influenza A NOT DETECTED NOT DETECTED Final   Influenza B NOT DETECTED NOT DETECTED Final   Parainfluenza Virus 1 NOT DETECTED NOT DETECTED Final   Parainfluenza Virus 2 NOT DETECTED NOT DETECTED Final   Parainfluenza Virus 3 NOT DETECTED NOT  DETECTED Final   Parainfluenza Virus 4 NOT DETECTED NOT DETECTED Final   Respiratory Syncytial Virus NOT DETECTED NOT DETECTED Final   Bordetella pertussis NOT DETECTED NOT DETECTED Final   Chlamydophila pneumoniae NOT DETECTED NOT DETECTED Final   Mycoplasma pneumoniae NOT DETECTED NOT DETECTED Final  MRSA PCR Screening     Status: None   Collection Time: 06/11/16  6:01 AM  Result Value Ref Range Status   MRSA by PCR NEGATIVE NEGATIVE Final    Comment:        The GeneXpert MRSA Assay (FDA approved for NASAL specimens only), is one component of a comprehensive MRSA colonization surveillance program. It is not intended to diagnose MRSA infection nor to guide or monitor treatment for MRSA infections.          Radiology Studies: No results found.      Scheduled Meds: . chlorhexidine  15 mL Mouth Rinse BID  . enoxaparin (LOVENOX) injection  30 mg Subcutaneous Q24H  . insulin aspart  0-9 Units Subcutaneous Q4H  . mouth rinse  15 mL Mouth Rinse q12n4p  . metoprolol  5 mg Intravenous Q6H  . sodium chloride flush  3 mL Intravenous Q12H  . valproate sodium  500 mg Intravenous Q12H   Continuous Infusions: . dextrose 5 % 1,000 mL with potassium chloride 40 mEq infusion 50 mL/hr at 06/14/16 1931     LOS: 5 days     Verneita Griffes, MD [facility] ([phone]  If 7PM-7AM, please contact night-coverage [email or web address] Password [facility] 06/15/2016, 8:27 AM

## 2016-06-15 NOTE — Progress Notes (Signed)
CSW received consult regarding discharge planning. Patient is from [facility]. Per facility, patient had orders for Hospice to follow her there but had nothing set up yet. CSW awaiting palliative team to see patient again to determine if residential Hospice is more appropriate.   Faith Wright LCSWA [phone]

## 2016-06-15 NOTE — Progress Notes (Signed)
CSW spoke with patient's granddaughter. They would like patient to discharge to either residential hospice or return to ALF with Hospice. CSW first pursuing residential. Referral sent to [facility] [address] [facility].   Faith Wright LCSWA [phone]

## 2016-06-16 ENCOUNTER — Emergency Department (HOSPITAL_COMMUNITY)
Admission: EM | Admit: 2016-06-16 | Discharge: 2016-06-17 | Disposition: A | Discharge status: Home / Self Care | Payer: Medicare Other | Attending: Emergency Medicine | Admitting: Emergency Medicine

## 2016-06-16 ENCOUNTER — Encounter (HOSPITAL_COMMUNITY): Payer: Self-pay | Admitting: Emergency Medicine

## 2016-06-16 DIAGNOSIS — Z79899 Other long term (current) drug therapy: Secondary | ICD-10-CM | POA: Insufficient documentation

## 2016-06-16 DIAGNOSIS — E119 Type 2 diabetes mellitus without complications: Secondary | ICD-10-CM | POA: Insufficient documentation

## 2016-06-16 DIAGNOSIS — N843 Polyp of vulva: Secondary | ICD-10-CM | POA: Diagnosis not present

## 2016-06-16 DIAGNOSIS — I10 Essential (primary) hypertension: Secondary | ICD-10-CM | POA: Insufficient documentation

## 2016-06-16 LAB — GLUCOSE, CAPILLARY
GLUCOSE-CAPILLARY: 150 mg/dL — AB (ref 65–99)
GLUCOSE-CAPILLARY: 96 mg/dL (ref 65–99)
Glucose-Capillary: 73 mg/dL (ref 65–99)
Glucose-Capillary: 210 mg/dL — ABNORMAL HIGH (ref 65–99)

## 2016-06-16 MED ORDER — METOPROLOL TARTRATE 25 MG PO TABS
25.0000 mg | ORAL_TABLET | Freq: Two times a day (BID) | ORAL | Status: DC
  Administered 2016-06-16: 25 mg via ORAL
  Filled 2016-06-16: qty 1

## 2016-06-16 MED ORDER — DIVALPROEX SODIUM 500 MG PO DR TAB
500.0000 mg | DELAYED_RELEASE_TABLET | Freq: Two times a day (BID) | ORAL | Status: DC
  Administered 2016-06-16: 500 mg via ORAL
  Filled 2016-06-16: qty 1

## 2016-06-16 MED ORDER — BISACODYL 10 MG RE SUPP: 10.0000 mg | Freq: Every day | RECTAL | 0 refills | Status: AC | PRN

## 2016-06-16 MED ORDER — METOPROLOL TARTRATE 25 MG PO TABS: 25.0000 mg | ORAL_TABLET | Freq: Two times a day (BID) | ORAL | 0 refills | Status: AC

## 2016-06-16 MED ORDER — CITALOPRAM HYDROBROMIDE 10 MG PO TABS: 10.0000 mg | ORAL_TABLET | Freq: Every day | ORAL | 0 refills | Status: AC

## 2016-06-16 MED ORDER — DIVALPROEX SODIUM 500 MG PO DR TAB: 500.0000 mg | DELAYED_RELEASE_TABLET | Freq: Two times a day (BID) | ORAL | 0 refills | Status: AC

## 2016-06-16 NOTE — Progress Notes (Signed)
Patient will DC to: [facility] Anticipated DC date: 06/17/15 Family notified: Granddaughter Transport by: Sherlyn HayPTAR 3pm   Per MD patient ready for DC to [facility]. RN, patient, patient's family, and facility notified of DC. Discharge Summary sent to facility. RN given number for report. DC packet on chart. Ambulance transport requested for patient.   CSW signing off.  Cristobal GoldmannNadia Clarence [place], [place] Clinical Social Worker [phone]

## 2016-06-16 NOTE — Discharge Instructions (Signed)
Labial polyp noted on exam.  No attention required.  If pt has intermittent uterine prolapse, it can be safely reduced at facility.

## 2016-06-16 NOTE — Progress Notes (Signed)
Pt prepared for d/c to SNF. IV d/c'd. Skin intact except as charted in most recent assessments. Vitals are stable. Report called to receiving facility. Pt to be transported by ambulance service. 

## 2016-06-16 NOTE — NC FL2 (Signed)
[place] MEDICAID FL2 LEVEL OF CARE SCREENING TOOL     IDENTIFICATION  Patient Name: Faith Wright Birthdate: [birth year] Sex: female Admission Date (Current Location): 06/10/2016  [facility] [place] Number:  Producer, television/film/video and Address:  [facility], [address], [place], [place] [address]      Provider Number: [id number]  Attending Physician Name and Address:  Rhetta Mura, MD  Relative Name and Phone Number:  [name] [phone]    Current Level of Care: Hospital Recommended Level of Care: Assisted Living Facility Prior Approval Number:    Date Approved/Denied:   PASRR Number:    Discharge Plan: Other (Comment) (ALF)    Current Diagnoses:  Patient Active Problem List   Diagnosis Date Noted  . Sepsis (HCC) 06/10/2016  . Decreased appetite   . Palliative care encounter   . Encounter for hospice care discussion   . Goals of care, counseling/discussion   . Protein-calorie malnutrition, severe 06/11/2016  . Dementia without behavioral disturbance   . Dehydration with hypernatremia 06/10/2016  . Seizure disorder (HCC) 06/10/2016  . Schizophrenia (HCC) 06/10/2016  . Diabetes mellitus type 2 in nonobese (HCC) 06/10/2016  . Acute kidney injury (HCC) 06/10/2016  . PAF (paroxysmal atrial fibrillation) (HCC) 06/10/2016  . Transaminitis 06/10/2016  . Constipation 06/10/2016  . Dementia 06/10/2016  . Altered mental status     Orientation RESPIRATION BLADDER Height & Weight      (Disoriented x4)  Normal Continent Weight: 55.3 kg (122 lb) Height:     BEHAVIORAL SYMPTOMS/MOOD NEUROLOGICAL BOWEL NUTRITION STATUS   (N/A)   Continent Diet (Pureed Diet)  AMBULATORY STATUS COMMUNICATION OF NEEDS Skin   Extensive Assist Verbally Normal                       Personal Care Assistance Level of Assistance  Bathing, Feeding, Dressing Bathing Assistance: Maximum assistance Feeding assistance: Maximum assistance Dressing  Assistance: Maximum assistance     Functional Limitations Info             SPECIAL CARE FACTORS FREQUENCY                       Contractures      Additional Factors Info  Code Status, Allergies Code Status Info: DNR Allergies Info: NKA           Current Medications (06/16/2016):   Discharge Medications: Current Discharge Medication List        START taking these medications   Details  bisacodyl (DULCOLAX) 10 MG suppository Place 1 suppository (10 mg total) rectally daily as needed for moderate constipation. Qty: 12 suppository, Refills: 0    metoprolol tartrate (LOPRESSOR) 25 MG tablet Take 1 tablet (25 mg total) by mouth 2 (two) times daily. Qty: 30 tablet, Refills: 0          CONTINUE these medications which have CHANGED   Details  citalopram (CELEXA) 10 MG tablet Take 1 tablet (10 mg total) by mouth daily with breakfast. Qty: 10 tablet, Refills: 0    divalproex (DEPAKOTE) 500 MG DR tablet Take 1 tablet (500 mg total) by mouth 2 (two) times daily. Qty: 20 tablet, Refills: 0          CONTINUE these medications which have NOT CHANGED   Details  acetaminophen (TYLENOL) 325 MG tablet Take 650 mg by mouth 2 (two) times daily as needed for mild pain, moderate pain, fever or headache.  Lurasidone HCl (LATUDA) 60 MG TABS Take 60 mg by mouth every morning.         STOP taking these medications     aspirin EC 81 MG tablet      benazepril (LOTENSIN) 10 MG tablet      benzonatate (TESSALON) 100 MG capsule      loratadine (CLARITIN) 10 MG tablet      metFORMIN (GLUCOPHAGE) 500 MG tablet      polyvinyl alcohol-povidone (HYPOTEARS) 1.4-0.6 % ophthalmic solution      traMADol (ULTRAM) 50 MG tablet      vitamin B-12 (CYANOCOBALAMIN) 1000 MCG tablet     Relevant Imaging Results:  Relevant Lab Results:   Additional Information SSN: [id number]  Lorrene Graef S [place] Star[place], [place]

## 2016-06-16 NOTE — ED Notes (Signed)
Patient waiting on PTAR. 

## 2016-06-16 NOTE — Progress Notes (Signed)
At this point in time, there is no bed availability [facility], [facility], or [facility]. CSW updated patient's granddaughter, Faith Wright. She is in agreement with discharging patient back to ALF with Hospice following rather than look at facilities that are farther away from the family.  Osborne Cascoadia Sahid Borba LCSWA [phone]

## 2016-06-16 NOTE — Discharge Summary (Signed)
Physician Discharge Summary  Faith Wright JYN:[id number] DOB: 04/11/1934 DOA: 06/10/2016  PCP: Lesia Hausen, PA  Admit date: 06/10/2016 Discharge date: 06/16/2016  Time spent: 40 minutes  Recommendations for Outpatient Follow-up:  1. recommned DNR, NO Hospitalization and NCB  2. See med changes 3. Recommend comfort care  Diet Dysphagia 1 (puree);Thin liquid Liquids provided via: Cup;Straw Medication Administration: Crushed with puree Supervision: Full supervision/cueing for compensatory strategies;Staff to assist with self feeding Compensations: Slow rate;Small sips/bites Postural Changes and/or Swallow Maneuvers: Seated upright 90 degrees  Discharge Diagnoses:  Principal Problem:   Sepsis ([place]) Active Problems:   Dehydration with hypernatremia   Seizure disorder (HCC)   Schizophrenia (HCC)   Diabetes mellitus type 2 in nonobese (HCC)   Acute kidney injury ([place])   PAF (paroxysmal atrial fibrillation) (HCC)   Transaminitis   Constipation   Dementia   Protein-calorie malnutrition, severe   Palliative care encounter   Encounter for hospice care discussion   Goals of care, counseling/discussion   Dementia without behavioral disturbance   Decreased appetite   Discharge Condition: gaurded  Diet recommendation: soft   Filed Weights   06/13/16 0529 06/14/16 0455 06/15/16 0334  Weight: 49.7 kg (109 lb 8 oz) 52.5 kg (115 lb 12.8 oz) 55.3 kg (122 lb)    History of present illness:  82 ?  DVT,  DM 2,  dementia,  seizure disorder,  schizophrenia,   3 ER visits since November (11/13: Generalized weakness, 12/19: Hip pain after fall, 12/22: Knee pain after fall), no significant injuries found on these evaluations and patient was returned to SNF, presented to [facility] ED on 06/10/16 after she was apparently found unresponsive.  upon EMS arrival heart rate of 250, underwent emergent cardioversion with 200 J with subsequent conversion to sinus rhythm/sinus tachycardia and was noted  to be febrile 103F.   Admitted for sepsis of unclear etiology, acute kidney injury and paroxysmal A. fib. Palliative care consulted and met with family on 06/13/16 for goals of care. As per nursing, no by mouth intake and hence unable to switch IV metoprolol to by mouth either. Discussed with palliative care MD and ultimately accepted back to her facility with Hospice following   Hospital Course:  1. Sepsis: Patient met sepsis criteria on admission. Etiology unclear. Treating empirically with IV Zosyn and vancomycin. Lactate has normalized. Pro-calcitonin elevated at 6.16. Blood cultures 2: Negative to date. Urine culture: Negative. RSV panel: Negative. Sepsis physiology resolved. Repeat chest x-ray without active disease.? All nonspecific viral illness. Has defervesced and without fever since 12/28. Discontinue vancomycin 12/30. Since cultures negative to date and no clear source of infection, clinically improved with fevers defervesced, normal WBC, discontinued all antibiotics 12/31 2. Acute kidney injury: Baseline creatinine in the 1.2 range. Presented with creatinine of 2.2. Suspect multifactorial secondary to dehydration and sepsis physiology. Resolved after IV fluids. At risk for recurrent acute kidney injury related to poor oral intake going forward 3. Paroxysmal A. fib with RVR: As per review of EMS telemetry strips by admitting team, most consistent with PAF. Required emergent cardioversion in the field. Remains in sinus rhythm. Likely precipitated by acute illness/sepsis physiology. chadvasc=4 but not a candidate for anticoagulation due to advanced age, advanced dementia/schizophrenia and high risk for bleeding complications. 2-D echo shows LVEF 60-65 percent and grade 1 diastolic dysfunction. Again rapid A. fib in the 130s-140s on 12/30 morning. Reverted to sinus rhythm after metoprolol 5 mg IV every 5 minutes 2 doses. Started on scheduled IV metoprolol 5 MG every  6 hours-changed to  metoprolol 25 bid on d/c home.   Although speech therapy has recommended diet, as per RN, patient not taking much PO and hence continue IV metoprolol for now. Palliative care team input appreciated 4. hypovolemic hypernatremia: Changed IV fluids to D5 infusion. Improved. Hypernatremia resolved. At risk for recurrent dehydration from poor oral intake. Repeat labs a.m. 06/16/2016.   5. Seizure disorder: Patient was found unresponsive and hypotensive. Unable to tell if her initial presentation was postictal. Mild rhabdomyolysis-improving.  6. Type II DM: Holding metformin due to acute kidney injury. SSI. A1c: 6.2 7. Macrocytic anemia: Stable. 8. Elevated troponin: May be due to demand ischemia from rapid A. fib, hemodynamic instability and sepsis physiology. 2-D echo has normal EF without wall motion abnormalities. Will not be a candidate for any aggressive intervention. 9. Dementia/schizophrenia: Baseline mental status not clear. Mental status at baseline as per discussion with granddaughter on 12/30. 10. Adult failure to thrive: Patient appears chronically ill, seems to have lower extremity contractures and may have advanced dementia. Palliative care input appreciated and recommended DO NOT RESUSCITATE, comfort measures only, antibiotics and IV fluids at SNF for comfort and hospice services at SNF. Discussed with Dr. Rowe Pavy: Advised her that patient is refusing oral intake. Palliative care team input appreciated 11. Constipation: Bowel regimen 12. Hypokalemia: Replace IV and follow. Magnesium 2.3.-recheck with next lab   Procedures:  multiple (i.e. Studies not automatically included, echos, thoracentesis, etc; not x-rays)  Consultations:   Cardiology  Palliative care  Discharge Exam: Vitals:   06/16/16 0009 06/16/16 0552  BP: (!) 117/51 (!) 170/78  Pulse: 66 78  Resp:  18  Temp:  98 F (36.7 C)    General: alert pleasant oriented and in nad.  Eating some food at the bedsdie.   chornically contracted and turned onto her Right side Cardiovascular: s1 s2 no m/r/g Respiratory: clear with no added sound  Discharge Instructions   Discharge Instructions    Diet - low sodium heart healthy    Complete by:  As directed    Increase activity slowly    Complete by:  As directed      Current Discharge Medication List    START taking these medications   Details  bisacodyl (DULCOLAX) 10 MG suppository Place 1 suppository (10 mg total) rectally daily as needed for moderate constipation. Qty: 12 suppository, Refills: 0    metoprolol tartrate (LOPRESSOR) 25 MG tablet Take 1 tablet (25 mg total) by mouth 2 (two) times daily. Qty: 30 tablet, Refills: 0      CONTINUE these medications which have CHANGED   Details  citalopram (CELEXA) 10 MG tablet Take 1 tablet (10 mg total) by mouth daily with breakfast. Qty: 10 tablet, Refills: 0    divalproex (DEPAKOTE) 500 MG DR tablet Take 1 tablet (500 mg total) by mouth 2 (two) times daily. Qty: 20 tablet, Refills: 0      CONTINUE these medications which have NOT CHANGED   Details  acetaminophen (TYLENOL) 325 MG tablet Take 650 mg by mouth 2 (two) times daily as needed for mild pain, moderate pain, fever or headache.    Lurasidone HCl (LATUDA) 60 MG TABS Take 60 mg by mouth every morning.      STOP taking these medications     aspirin EC 81 MG tablet      benazepril (LOTENSIN) 10 MG tablet      benzonatate (TESSALON) 100 MG capsule      loratadine (CLARITIN) 10 MG tablet  metFORMIN (GLUCOPHAGE) 500 MG tablet      polyvinyl alcohol-povidone (HYPOTEARS) 1.4-0.6 % ophthalmic solution      traMADol (ULTRAM) 50 MG tablet      vitamin B-12 (CYANOCOBALAMIN) 1000 MCG tablet        No Known Allergies    The results of significant diagnostics from this hospitalization (including imaging, microbiology, ancillary and laboratory) are listed below for reference.    Significant Diagnostic Studies: X-ray Chest Pa  And Lateral  Result Date: 06/11/2016 CLINICAL DATA:  Sepsis EXAM: CHEST  2 VIEW COMPARISON:  06/10/2016 FINDINGS: Mild cardiomegaly. Lungs are clear. No effusions. Aortic tortuosity and calcifications. No acute bony abnormality. IMPRESSION: Mild cardiomegaly.  No active disease. Aortic atherosclerosis. Electronically Signed   By: Rolm Baptise M.D.   On: 06/11/2016 08:20   Dg Knee 2 Views Left  Result Date: 06/04/2016 CLINICAL DATA:  [place] 3 days ago.  Ecchymosis and soreness. EXAM: LEFT KNEE - 1-2 VIEW COMPARISON:  None. FINDINGS: Marked study limitations due to flexion contractures and positioning challenges. No acute fracture or dislocation is evident. No radiopaque foreign body. No soft tissue gas. IMPRESSION: Limited study, negative for significant abnormality. Electronically Signed   By: Andreas Newport M.D.   On: 06/04/2016 19:16   Dg Knee 2 Views Right  Result Date: 06/04/2016 CLINICAL DATA:  [place] several days ago.  Swelling and ecchymosis. EXAM: RIGHT KNEE - 1-2 VIEW COMPARISON:  None. FINDINGS: Significant study limitations due to flexion contractures and positioning challenges. No fracture or dislocation is evident. No soft tissue gas or foreign body. IMPRESSION: Limited study, negative for significant abnormality. Electronically Signed   By: Andreas Newport M.D.   On: 06/04/2016 19:25   Ct Head Wo Contrast  Result Date: 06/10/2016 CLINICAL DATA:  Code sepsis EXAM: CT HEAD WITHOUT CONTRAST TECHNIQUE: Contiguous axial images were obtained from the base of the skull through the vertex without intravenous contrast. COMPARISON:  04/26/2016 FINDINGS: Brain: Severe chronic small vessel disease changes throughout the deep white matter. Diffuse cerebral atrophy. Physiologic calcifications in the basal ganglia bilaterally. No acute intracranial abnormality. Specifically, no hemorrhage, hydrocephalus, mass lesion, acute infarction, or significant intracranial injury. Vascular: No hyperdense  vessel or unexpected calcification. Skull: No acute calvarial abnormality. Sinuses/Orbits: Visualized paranasal sinuses and mastoids clear. Orbital soft tissues unremarkable. Other: None IMPRESSION: Severe chronic small vessel disease changes and diffuse cerebral atrophy. No acute intracranial abnormality. Electronically Signed   By: Rolm Baptise M.D.   On: 06/10/2016 10:03   Dg Chest Port 1 View  Result Date: 06/10/2016 CLINICAL DATA:  Fever and altered mental status EXAM: PORTABLE CHEST 1 VIEW COMPARISON:  April 16, 2016 FINDINGS: There is a probable nipple shadow at the right base. There is no edema or consolidation. Heart is upper normal in size with pulmonary vascularity within normal limits. No adenopathy. There is atherosclerotic calcification in the aorta. No bone lesions. IMPRESSION: Suspect nipple shadow on the right; repeat study with nipple markers could be helpful to confirm. No edema or consolidation. Stable cardiac silhouette. There is aortic atherosclerosis. Electronically Signed   By: Lowella Grip III M.D.   On: 06/10/2016 09:13   Dg Abd Portable 1v  Result Date: 06/10/2016 CLINICAL DATA:  Constipation. Altered mental status. History of diabetes. EXAM: PORTABLE ABDOMEN - 1 VIEW COMPARISON:  Limited correlation made with hip radiographs 06/01/2016. FINDINGS: 1358 hours. The bowel gas pattern is nonobstructive. Colonic stool burden does not appear significantly increased. No supine evidence of free intraperitoneal air. Aortoiliac atherosclerosis  and probable pelvic phleboliths noted. There are degenerative changes in the lower lumbar spine. IMPRESSION: No acute abdominal findings.  Nonobstructive bowel gas pattern. Electronically Signed   By: Richardean Sale M.D.   On: 06/10/2016 14:07   Dg Hip Unilat W Or Wo Pelvis 2-3 Views Right  Result Date: 06/01/2016 CLINICAL DATA:  Right hip pain due to a fall today. Initial encounter. EXAM: DG HIP (WITH OR WITHOUT PELVIS) 2-3V RIGHT  COMPARISON:  None. FINDINGS: The right hip is located. Coxa vera deformity is noted. No fracture is seen. Right hip osteoarthritis is identified. IMPRESSION: No acute abnormality is identified. If clinical concern persists cross-sectional imaging, preferably MRI without contrast, is recommended for further evaluation. Electronically Signed   By: Inge Rise M.D.   On: 06/01/2016 10:49    Microbiology: Recent Results (from the past 240 hour(s))  Blood Culture (routine x 2)     Status: None   Collection Time: 06/10/16  9:00 AM  Result Value Ref Range Status   Specimen Description BLOOD RIGHT ARM UPPER  Final   Special Requests BOTTLES DRAWN AEROBIC AND ANAEROBIC  5CC  Final   Culture NO GROWTH 5 DAYS  Final   Report Status 06/15/2016 FINAL  Final  Blood Culture (routine x 2)     Status: None   Collection Time: 06/10/16  9:19 AM  Result Value Ref Range Status   Specimen Description BLOOD RIGHT WRIST  Final   Special Requests BOTTLES DRAWN AEROBIC ONLY  3CC  Final   Culture NO GROWTH 5 DAYS  Final   Report Status 06/15/2016 FINAL  Final  Urine culture     Status: None   Collection Time: 06/10/16 11:10 AM  Result Value Ref Range Status   Specimen Description URINE, RANDOM  Final   Special Requests NONE  Final   Culture NO GROWTH  Final   Report Status 06/11/2016 FINAL  Final  Respiratory Panel by PCR     Status: None   Collection Time: 06/10/16 10:21 PM  Result Value Ref Range Status   Adenovirus NOT DETECTED NOT DETECTED Final   Coronavirus 229E NOT DETECTED NOT DETECTED Final   Coronavirus HKU1 NOT DETECTED NOT DETECTED Final   Coronavirus NL63 NOT DETECTED NOT DETECTED Final   Coronavirus OC43 NOT DETECTED NOT DETECTED Final   Metapneumovirus NOT DETECTED NOT DETECTED Final   Rhinovirus / Enterovirus NOT DETECTED NOT DETECTED Final   Influenza A NOT DETECTED NOT DETECTED Final   Influenza B NOT DETECTED NOT DETECTED Final   Parainfluenza Virus 1 NOT DETECTED NOT DETECTED  Final   Parainfluenza Virus 2 NOT DETECTED NOT DETECTED Final   Parainfluenza Virus 3 NOT DETECTED NOT DETECTED Final   Parainfluenza Virus 4 NOT DETECTED NOT DETECTED Final   Respiratory Syncytial Virus NOT DETECTED NOT DETECTED Final   Bordetella pertussis NOT DETECTED NOT DETECTED Final   Chlamydophila pneumoniae NOT DETECTED NOT DETECTED Final   Mycoplasma pneumoniae NOT DETECTED NOT DETECTED Final  MRSA PCR Screening     Status: None   Collection Time: 06/11/16  6:01 AM  Result Value Ref Range Status   MRSA by PCR NEGATIVE NEGATIVE Final    Comment:        The GeneXpert MRSA Assay (FDA approved for NASAL specimens only), is one component of a comprehensive MRSA colonization surveillance program. It is not intended to diagnose MRSA infection nor to guide or monitor treatment for MRSA infections.      Labs: Basic Metabolic Panel:  Recent Labs Lab 06/10/16 0900 06/10/16 1939 06/11/16 0324 06/12/16 0609 06/13/16 0811  NA 150*  --  150* 150* 145  K 4.4  --  3.5 3.1* 3.5  CL 107  --  115* 115* 113*  CO2 26  --  _0 GLUCOSE 194*  --  142* 160* 124*  BUN 73*  --  69* 54* 38*  CREATININE 2.20*  --  1.81* 1.40* 1.12*  CALCIUM 9.9  --  8.5* 8.6* 8.7*  MG  --  2.4  --  2.3  --   PHOS  --  4.3  --   --   --    Liver Function Tests:  Recent Labs Lab 06/10/16 0900 06/11/16 0324  AST 58* 59*  ALT 25 23  ALKPHOS 53 40  BILITOT 0.9 0.7  PROT 7.7 5.4*  ALBUMIN 2.5* 1.9*   No results for input(s): LIPASE, AMYLASE in the last 168 hours. No results for input(s): AMMONIA in the last 168 hours. CBC:  Recent Labs Lab 06/10/16 0900 06/10/16 1014 06/11/16 0324 06/12/16 0609  WBC 19.0* 10.7* 10.6* 9.3  NEUTROABS 14.0*  --   --   --   HGB 4.1* 9.2* 8.7* 9.7*  HCT 12.6* 29.0* 27.1* 30.1*  MCV 101.6* 102.5* 102.3* 101.3*  PLT 455* 264 250 277   Cardiac Enzymes:  Recent Labs Lab 06/10/16 1939 06/10/16 2045 06/11/16 0324 06/12/16 0609  CKTOTAL 2,236*  --    --  1,553*  TROPONINI 1.24* 1.31* 1.17*  --    BNP: BNP (last 3 results) No results for input(s): BNP in the last 8760 hours.  ProBNP (last 3 results) No results for input(s): PROBNP in the last 8760 hours.  CBG:  Recent Labs Lab 06/15/16 1640 06/15/16 1946 06/16/16 0002 06/16/16 0438 06/16/16 0749  GLUCAP 168* 150* 96 150* 73       Signed:  Nita Sells MD   [facility] 06/16/2016, 9:49 AM

## 2016-06-16 NOTE — Progress Notes (Signed)
Speech Language Pathology Treatment: Dysphagia  Patient Details Name: Faith Wright MRN: [id number] DOB: 05/14/1934 Today's Date: 06/16/2016 Time: [id number] SLP Time Calculation (min) (ACUTE ONLY): 9 min  Assessment / Plan / Recommendation Clinical Impression  Pt seen with am meal. SLP repositioned pt and provided verbal and tactile cues to increase pt awareness of PO. Pt responds well to liquid touched to lip to initiate appropriate acceptance of PO. It was more difficulty to provide purees, but a verbal cue for pt to open mouth helped. Moderately prolonged oral pumping observed, reduced with a liquid wash. Pt is tolerating current diet with total assist. Education completed with family last visit. No SLP f/u needed at this time given transition to comfort measures.    HPI HPI: Patient is an 83 year old female with past medical history of dementia, schizophrenia, diabetes, hypertension, recurrent UTI. Recently seen in ED (06/01/16) after a fall at her nursing facility. Presented 06/10/16 with AMS, admitted with sepsis and dehydration. Lungs clear per Chest X- Ray 06/11/16.  Head CT 06/10/16 showed severe chronic small vessel disease changes and diffuse cerebral atrophy, with no acute findings. No prior SLP evaluations found in chart. Referred for bedside swallowing evaluation.      SLP Plan  Continue with current plan of care     Recommendations  Diet recommendations: Dysphagia 1 (puree);Thin liquid Liquids provided via: Cup Medication Administration: Crushed with puree Supervision: Full supervision/cueing for compensatory strategies;Staff to assist with self feeding Compensations: Slow rate;Small sips/bites Postural Changes and/or Swallow Maneuvers: Seated upright 90 degrees                Oral Care Recommendations: Oral care QID Follow up Recommendations: Skilled Nursing facility Plan: Continue with current plan of care       GO               [facility] Surgery Center LPBonnie Dyer Klug, MA CCC-SLP  [phone]  Claudine MoutonDeBlois, Jayan Raymundo Caroline 06/16/2016, 9:54 AM

## 2016-06-16 NOTE — Clinical Social Work Note (Signed)
Clinical Social Work Assessment  Patient Details  Name: Faith Wright MRN: [id number] Date of Birth: 06-24-1933  Date of referral:  06/16/16               Reason for consult:  End of Life/Hospice                Permission sought to share information with:  Facility Medical sales representativeContact Representative, Family Supports Permission granted to share information::  No  Name::     Educational psychologistLanesha  Agency::  Hospice  Relationship::  Granddaughter  Contact Information:  [phone]  Housing/Transportation Living arrangements for the past 2 months:  Assisted Living Facility Source of Information:  Adult Children Patient Interpreter Needed:  None Criminal Activity/Legal Involvement Pertinent to Current Situation/Hospitalization:  No - Comment as needed Significant Relationships:  Adult Children, Other Family Members Lives with:  Facility Resident Do you feel safe going back to the place where you live?  No Need for family participation in patient care:  Yes (Comment)  Care giving concerns:  CSW received consult regarding discharge planning. Patient is disoriented. CSW spoke with patient's granddaughter, Faith Wright. Her father is HCPOA, but he has speech difficulties. The family would like for patient to discharge to whatever facility is best for her. CSW explained that search would begin for residential hospice facility first and if it is not approved, then the plan would be for patient to [facility]. Faith Wright expressed her agreement, though she was hoping for patient to be moved closer to where they [place] in [place]. CSW explained barriers of arranging transportation. CSW to continue to follow.    Social Worker assessment / plan:  CSW to help arrange discharge to appropriate facility.   Employment status:  Retired Health and safety inspectornsurance information:  [place] Recommendations[place]  Not assessed at this time Information / Referral to community resources:  Other (Comment Required)  Patient/Family's  Response to care:  Faith Wright and her father report wanting what is best for the patient and want her to be comfortable at this time.   Patient/Family's Understanding of and Emotional Response to Diagnosis, Current Treatment, and Prognosis:  No questions or concerns reported at this time.  Emotional Assessment Appearance:  Appears stated age Attitude/Demeanor/Rapport:  Unable to Assess Affect (typically observed):  Unable to Assess Orientation:   (Disoriented x4) Alcohol / Substance use:  Not Applicable Psych involvement (Current and /or in the community):  No (Comment)  Discharge Needs  Concerns to be addressed:  Care Coordination, Grief and Loss Concerns Readmission within the last 30 days:  No Current discharge risk:  None Barriers to Discharge:  No Barriers Identified   Faith Wright, LCSWA 06/16/2016, 8:53 AM

## 2016-06-16 NOTE — ED Triage Notes (Signed)
Pt sent from [facility] for health status change when staff notice protrustion from pt vagina no obvious signs of pain no bleeding

## 2016-06-16 NOTE — ED Provider Notes (Signed)
[facility]-EMERGENCY DEPT Provider Note   CSN: [id number] Arrival date & time: 06/16/16  2300  By signing my name below, I, [name], attest that this documentation has been prepared under the direction and in the presence of Rolland Porter, MD. Electronically signed, [name], ED Scribe. 06/16/16. 11:18 PM.   History   Chief Complaint Chief Complaint  Patient presents with  . Vaginal Pain    HPI HPI Comments: LEVEL 5 CAVEAT DUE TO DEMENTIA Faith Wright is a 83 y.o. female, with Hx of DM, Dementia, who presents to the Emergency Department from her living facility after care staff noticed a "protrustion from her vagina". Pt was discharged today after an admission on 06/10/16 for septic shock.  The history is provided by the nursing home. No language interpreter was used.    Past Medical History:  Diagnosis Date  . Dementia   . Diabetes mellitus without complication (HCC)   . DVT (deep vein thrombosis) in pregnancy (HCC)   . Hypertension   . Schizophrenia [facility])     Patient Active Problem List   Diagnosis Date Noted  . Dementia without behavioral disturbance   . Decreased appetite   . Palliative care encounter   . Encounter for hospice care discussion   . Goals of care, counseling/discussion   . Protein-calorie malnutrition, severe 06/11/2016  . Sepsis (HCC) 06/10/2016  . Dehydration with hypernatremia 06/10/2016  . Seizure disorder (HCC) 06/10/2016  . Schizophrenia (HCC) 06/10/2016  . Diabetes mellitus type 2 in nonobese (HCC) 06/10/2016  . Acute kidney injury (HCC) 06/10/2016  . PAF (paroxysmal atrial fibrillation) (HCC) 06/10/2016  . Transaminitis 06/10/2016  . Constipation 06/10/2016  . Dementia 06/10/2016  . Altered mental status     No past surgical history on file.  OB History    No data available       Home Medications    Prior to Admission medications   Medication Sig Start Date End Date Taking? Authorizing Provider  acetaminophen (TYLENOL) 325 MG  tablet Take 650 mg by mouth 2 (two) times daily as needed for mild pain, moderate pain, fever or headache.    Historical Provider, MD  bisacodyl (DULCOLAX) 10 MG suppository Place 1 suppository (10 mg total) rectally daily as needed for moderate constipation. 06/16/16   Rhetta Mura, MD  citalopram (CELEXA) 10 MG tablet Take 1 tablet (10 mg total) by mouth daily with breakfast. 06/16/16   Rhetta Mura, MD  divalproex (DEPAKOTE) 500 MG DR tablet Take 1 tablet (500 mg total) by mouth 2 (two) times daily. 06/16/16   Rhetta Mura, MD  Lurasidone HCl (LATUDA) 60 MG TABS Take 60 mg by mouth every morning.    Historical Provider, MD  metoprolol tartrate (LOPRESSOR) 25 MG tablet Take 1 tablet (25 mg total) by mouth 2 (two) times daily. 06/16/16   Rhetta Mura, MD    Family History No family history on file.  Social History Social History  Substance Use Topics  . Smoking status: Never Smoker  . Smokeless tobacco: Never Used  . Alcohol use No     Allergies   Patient has no known allergies.   Review of Systems Review of Systems  Unable to perform ROS: Dementia    Physical Exam Updated Vital Signs BP 169/85 (BP Location: Left Arm)   Pulse 72   Temp 98.2 F (36.8 C) (Oral)   Resp 18   SpO2 97%   Physical Exam  Constitutional: She is oriented to person, place, and time. She appears well-developed  and well-nourished. No distress.  HENT:  Head: Normocephalic.  Eyes: Conjunctivae are normal. Pupils are equal, round, and reactive to light. No scleral icterus.  Neck: Normal range of motion. Neck supple. No thyromegaly present.  Cardiovascular: Normal rate and regular rhythm.  Exam reveals no gallop and no friction rub.   No murmur heard. Pulmonary/Chest: Effort normal and breath sounds normal. No respiratory distress. She has no wheezes. She has no rales.  Abdominal: Soft. Bowel sounds are normal. She exhibits no distension. There is no tenderness. There is no rebound.   Genitourinary:  Genitourinary Comments: Mild swelling bilateral labia. Left sided labial polyp. No prolapse.  Musculoskeletal: Normal range of motion.  Neurological: She is alert and oriented to person, place, and time.  Skin: Skin is warm and dry. No rash noted.  Psychiatric: She has a normal mood and affect. Her behavior is normal.    ED Treatments / Results  DIAGNOSTIC STUDIES: Oxygen Saturation is 97% on RA, normal by my interpretation.  COORDINATION OF CARE: 11:13 PM-Discussed treatment plan with pt at bedside and pt agreed to plan.   Labs (all labs ordered are listed, but only abnormal results are displayed) Labs Reviewed - No data to display  EKG  EKG Interpretation None       Radiology No results found.  Procedures Procedures (including critical care time)  Medications Ordered in ED Medications - No data to display   Initial Impression / Assessment and Plan / ED Course  I have reviewed the triage vital signs and the nursing notes.  Pertinent labs & imaging results that were available during my care of the patient were reviewed by me and considered in my medical decision making (see chart for details).  Clinical Course    Small labial polyp noted. No attention required. No prolapse   Final Clinical Impressions(s) / ED Diagnoses   Final diagnoses:  Vulvar/labial polyp    New Prescriptions New Prescriptions   No medications on file  I personally performed the services described in this documentation, which was scribed in my presence. The recorded information has been reviewed and is accurate.     Rolland PorterMark Orine Goga, MD 06/16/16 [phone]

## 2016-06-16 NOTE — Care Management Note (Signed)
Case Management Note  Patient Details  Name: Faith Wright MRN: [id number] Date of Birth: 07/02/1933  Subjective/Objective:                 DC to ALF with home hospice as facilitated by ALF.   Action/Plan:   Expected Discharge Date:                  Expected Discharge Plan:  Assisted Living / Rest Home  In-House Referral:  Clinical Social Work  Discharge planning Services  CM Consult  Post Acute Care Choice:    Choice offered to:     DME Arranged:    DME Agency:     HH Arranged:    HH Agency:     Status of Service:  Completed, signed off  If discussed at [employer] Meetings, dates discussed:    Additional Comments:  Lawerance SabalDebbie Ulanda Tackett, RN 06/16/2016, 10:06 AM

## 2016-06-16 NOTE — ED Notes (Signed)
Bed: [id number] Expected date:  Expected time:  Means of arrival:  Comments: [age] yr old vaginal issues/urinary retention

## 2016-06-17 ENCOUNTER — Encounter (HOSPITAL_COMMUNITY): Payer: Self-pay | Admitting: Nurse Practitioner

## 2016-06-17 ENCOUNTER — Emergency Department (HOSPITAL_COMMUNITY): Payer: Medicare Other

## 2016-06-17 ENCOUNTER — Emergency Department (HOSPITAL_COMMUNITY)
Admission: EM | Admit: 2016-06-17 | Discharge: 2016-06-17 | Disposition: A | Discharge status: Home / Self Care | Payer: Medicare Other | Source: Home / Self Care | Attending: Emergency Medicine | Admitting: Emergency Medicine

## 2016-06-17 DIAGNOSIS — E119 Type 2 diabetes mellitus without complications: Secondary | ICD-10-CM

## 2016-06-17 DIAGNOSIS — Z7982 Long term (current) use of aspirin: Secondary | ICD-10-CM | POA: Insufficient documentation

## 2016-06-17 DIAGNOSIS — N9089 Other specified noninflammatory disorders of vulva and perineum: Secondary | ICD-10-CM | POA: Insufficient documentation

## 2016-06-17 DIAGNOSIS — I10 Essential (primary) hypertension: Secondary | ICD-10-CM | POA: Insufficient documentation

## 2016-06-17 DIAGNOSIS — R102 Pelvic and perineal pain: Secondary | ICD-10-CM

## 2016-06-17 DIAGNOSIS — N9489 Other specified conditions associated with female genital organs and menstrual cycle: Secondary | ICD-10-CM

## 2016-06-17 DIAGNOSIS — Z7984 Long term (current) use of oral hypoglycemic drugs: Secondary | ICD-10-CM

## 2016-06-17 LAB — URINALYSIS, ROUTINE W REFLEX MICROSCOPIC
BILIRUBIN URINE: NEGATIVE
GLUCOSE, UA: NEGATIVE mg/dL
Hgb urine dipstick: NEGATIVE
KETONES UR: 5 mg/dL — AB
LEUKOCYTES UA: NEGATIVE
Nitrite: NEGATIVE
Protein, ur: NEGATIVE mg/dL
Specific Gravity, Urine: 1.021 (ref 1.005–1.030)
pH: 5 (ref 5.0–8.0)

## 2016-06-17 LAB — BASIC METABOLIC PANEL
ANION GAP: 6 (ref 5–15)
BUN: 20 mg/dL (ref 6–20)
CALCIUM: 8.9 mg/dL (ref 8.9–10.3)
CO2: 27 mmol/L (ref 22–32)
CREATININE: 0.73 mg/dL (ref 0.44–1.00)
Chloride: 109 mmol/L (ref 101–111)
Glucose, Bld: 102 mg/dL — ABNORMAL HIGH (ref 65–99)
Potassium: 4.7 mmol/L (ref 3.5–5.1)
Sodium: 142 mmol/L (ref 135–145)

## 2016-06-17 LAB — CBC WITH DIFFERENTIAL/PLATELET
BASOS ABS: 0 10*3/uL (ref 0.0–0.1)
BASOS PCT: 0 %
EOS ABS: 0 10*3/uL (ref 0.0–0.7)
EOS PCT: 0 %
HCT: 30.3 % — ABNORMAL LOW (ref 36.0–46.0)
Hemoglobin: 10 g/dL — ABNORMAL LOW (ref 12.0–15.0)
Lymphocytes Relative: 22 %
Lymphs Abs: 1.8 10*3/uL (ref 0.7–4.0)
MCH: 32.2 pg (ref 26.0–34.0)
MCHC: 33 g/dL (ref 30.0–36.0)
MCV: 97.4 fL (ref 78.0–100.0)
MONO ABS: 0.7 10*3/uL (ref 0.1–1.0)
Monocytes Relative: 9 %
Neutro Abs: 5.6 10*3/uL (ref 1.7–7.7)
Neutrophils Relative %: 69 %
PLATELETS: 304 10*3/uL (ref 150–400)
RBC: 3.11 MIL/uL — ABNORMAL LOW (ref 3.87–5.11)
RDW: 15.1 % (ref 11.5–15.5)
WBC: 8.1 10*3/uL (ref 4.0–10.5)

## 2016-06-17 MED ORDER — CEPHALEXIN 250 MG PO CAPS
250.0000 mg | ORAL_CAPSULE | Freq: Once | ORAL | Status: AC
  Administered 2016-06-17: 250 mg via ORAL
  Filled 2016-06-17: qty 1

## 2016-06-17 MED ORDER — CEPHALEXIN 500 MG PO CAPS: 500.0000 mg | ORAL_CAPSULE | Freq: Four times a day (QID) | ORAL | 0 refills | Status: AC

## 2016-06-17 NOTE — ED Notes (Signed)
Pt resting on stretcher awaiting PTAR

## 2016-06-17 NOTE — ED Notes (Signed)
[facility] here to transport pt back to [facility] facility. 

## 2016-06-17 NOTE — ED Provider Notes (Signed)
Patient will be treated for presumed cellulitis with by mouth Keflex for 7 days.  She is to make an appointment with her primary care provider to you have this therapy checked in 3-5 days   Earley FavorGail Allante Beane, NP 06/17/16 2216    Earley FavorGail Caitlin Ainley, NP 06/17/16 [id number]    Tilden FossaElizabeth Rees, MD 06/19/16 [phone]

## 2016-06-17 NOTE — ED Triage Notes (Signed)
[facility], [facility]. Facility want pt evaluated for pelvic pain, vaginal swelling post foley cath.

## 2016-06-17 NOTE — ED Provider Notes (Signed)
[facility]-EMERG[facility]Y DEPT Provider Note   CSN: [id number] Arrival date & time: 06/17/16  1658     History   Chief Complaint Chief Complaint  Patient presents with  . Vaginal Pain  . Pelvic Pain    HPI Faith Wright is a 83 y.o. female.  LEVEL 5 CAVEAT-DEMENTIA  HPI Patient presents from [facility] for evaluation for vaginal pain.  Patient was seen yesterday at [facility] for vaginal pain, noted to have labial polyp and discharged home.  She denies any pain, and states she does not know why she is here. Spoke with Faith Battiest, RN at [facility] who was not able to give a good history as to why the patient was here aside from she was told to sent the patient to the ED for evaluation for pelvic pain.  Patient currently is on Tramadol for pain. Spoke with family, Faith Wright (granddaughter) who did not voice any patient complaints.    Per chart review, has been seen in ED multiple times for fall and most recently sepsis and discharged yesterday.  Xrays of her hips and knees were unremarkable.  She has had no new falls.    Past Medical History:  Diagnosis Date  . Dementia   . Diabetes mellitus without complication (HCC)   . DVT (deep vein thrombosis) in pregnancy (HCC)   . Hypertension   . Schizophrenia [facility])     Patient Active Problem List   Diagnosis Date Noted  . Dementia without behavioral disturbance   . Decreased appetite   . Palliative care encounter   . Encounter for hospice care discussion   . Goals of care, counseling/discussion   . Protein-calorie malnutrition, severe 06/11/2016  . Sepsis (HCC) 06/10/2016  . Dehydration with hypernatremia 06/10/2016  . Seizure disorder (HCC) 06/10/2016  . Schizophrenia (HCC) 06/10/2016  . Diabetes mellitus type 2 in nonobese (HCC) 06/10/2016  . Acute kidney injury (HCC) 06/10/2016  . PAF (paroxysmal atrial fibrillation) (HCC) 06/10/2016  . Transaminitis 06/10/2016  . Constipation 06/10/2016  . Dementia 06/10/2016  . Altered mental  status     History reviewed. No pertinent surgical history.  OB History    No data available       Home Medications    Prior to Admission medications   Medication Sig Start Date End Date Taking? Authorizing Provider  acetaminophen (TYLENOL) 325 MG tablet Take 650 mg by mouth 2 (two) times daily as needed for mild pain, moderate pain, fever or headache.   Yes Historical Provider, MD  aspirin EC 81 MG tablet Take 81 mg by mouth daily.   Yes Historical Provider, MD  benazepril (LOTENSIN) 10 MG tablet Take 10 mg by mouth daily.   Yes Historical Provider, MD  Chlorpheniramine Maleate (ALLERGY PO) Take 10 mg by mouth daily as needed (allergies).   Yes Historical Provider, MD  citalopram (CELEXA) 10 MG tablet Take 1 tablet (10 mg total) by mouth daily with breakfast. 06/16/16  Yes Rhetta Mura, MD  divalproex (DEPAKOTE) 500 MG DR tablet Take 1 tablet (500 mg total) by mouth 2 (two) times daily. 06/16/16  Yes Rhetta Mura, MD  Lurasidone HCl (LATUDA) 60 MG TABS Take 60 mg by mouth every morning.   Yes Historical Provider, MD  metFORMIN (GLUCOPHAGE) 500 MG tablet Take 500 mg by mouth daily with breakfast.   Yes Historical Provider, MD  polyvinyl alcohol (LIQUIFILM TEARS) 1.4 % ophthalmic solution Place 1 drop into both eyes 3 (three) times daily.   Yes Historical Provider, MD  traMADol (  ULTRAM) 50 MG tablet Take 50 mg by mouth every 8 (eight) hours as needed for moderate pain or severe pain.   Yes Historical Provider, MD  vitamin B-12 (CYANOCOBALAMIN) 1000 MCG tablet Take 1,000 mcg by mouth daily.   Yes Historical Provider, MD  bisacodyl (DULCOLAX) 10 MG suppository Place 1 suppository (10 mg total) rectally daily as needed for moderate constipation. 06/16/16   Rhetta MuraJai-Gurmukh Samtani, MD  metoprolol tartrate (LOPRESSOR) 25 MG tablet Take 1 tablet (25 mg total) by mouth 2 (two) times daily. 06/16/16   Rhetta MuraJai-Gurmukh Samtani, MD    Family History History reviewed. No pertinent family  history.  Social History Social History  Substance Use Topics  . Smoking status: Never Smoker  . Smokeless tobacco: Never Used  . Alcohol use No     Allergies   Patient has no known allergies.   Review of Systems Review of Systems  Unable to perform ROS: Dementia     Physical Exam Updated Vital Signs BP 158/59 (BP Location: Left Arm)   Pulse 85   Temp 97.9 F (36.6 C) (Oral)   Resp 17   SpO2 97%   Physical Exam  Constitutional: She appears well-developed and well-nourished. She does not appear ill.  Elderly, frail.   HENT:  Head: Normocephalic and atraumatic.  Mouth/Throat: Oropharynx is clear and moist.  Eyes: Conjunctivae are normal.  Neck: Normal range of motion. Neck supple.  Cardiovascular: Normal rate and regular rhythm.   Pulmonary/Chest: Effort normal and breath sounds normal. No accessory muscle usage or stridor. No respiratory distress. She has no wheezes. She has no rhonchi. She has no rales.  Abdominal: Soft. Bowel sounds are normal. She exhibits no distension. There is no tenderness.  Genitourinary:  Genitourinary Comments: Chaperone present during exam. Left labial polyp noted with mild bilateral labial swelling.  No prolapse.  No warmth, erythema, or discharge. No lesions.   Musculoskeletal: Normal range of motion.  Lymphadenopathy:    She has no cervical adenopathy.  Neurological: She is alert.  Speech clear without dysarthria.  Skin: Skin is warm and dry.  Psychiatric: She has a normal mood and affect. Her behavior is normal.     ED Treatments / Results  Labs (all labs ordered are listed, but only abnormal results are displayed) Labs Reviewed  CBC WITH DIFFERENTIAL/PLATELET  BASIC METABOLIC PANEL  URINALYSIS, ROUTINE W REFLEX MICROSCOPIC    EKG  EKG Interpretation None       Radiology No results found.  Procedures Procedures (including critical care time)  Medications Ordered in ED Medications - No data to  display   Initial Impression / Assessment and Plan / ED Course  I have reviewed the triage vital signs and the nursing notes.  Pertinent labs & imaging results that were available during my care of the patient were reviewed by me and considered in my medical decision making (see chart for details).  Clinical Course    [facility].  Attempted to obtain hx from facility and family, but was not able to obtain specific cause for ED visit aside from vaginal pain.  She was seen her yesterday for the same complaint.  Vitals stable.  She has mild bilateral labial swelling and mild erythema and a labial polyp.  Given recent admission for sepsis, will obtain labs including UA.  If negative, plan to discharge home with Keflex for possible early cellulitis.  Patient care hand off to oncoming provider who will follow up won labs and  UA and appropriate disposition.    Case has been discussed with and seen by Dr. Madilyn Hook who agrees with the above plan.   Final Clinical Impressions(s) / ED Diagnoses   Final diagnoses:  Vaginal pain    New Prescriptions New Prescriptions   No medications on file     Gwinda Maine 06/17/16 2051    Tilden Fossa, MD 06/19/16 1556

## 2016-06-17 NOTE — Discharge Instructions (Signed)
Patient is being treated for presumed cellulitis of the labia.  Due to the edema.  Please make an appointment with her primary care physician to have the area rechecked in 3-5 days.

## 2016-06-17 NOTE — ED Notes (Signed)
Bed: [id number] Expected date:  Expected time:  Means of arrival:  Comments: EMS- 82yo F, abdominal pain x 1 day/Hx of dementia

## 2016-06-17 NOTE — ED Notes (Signed)
[facility] called for transportation  

## 2016-07-15 DEATH — deceased

## 2018-09-28 IMAGING — CT CT HEAD W/O CM
4 of 6 series · 15 of 47 positions shown, 17 images · non-contrast
Comparison: None.

CLINICAL DATA: 82-year-old female with generalized weakness
discovered this morning. Possible seizure. Initial encounter.

EXAM:
CT HEAD WITHOUT CONTRAST
TECHNIQUE: Contiguous axial images were obtained from the base of the skull
through the vertex without intravenous contrast.

[Series 2: head w/o · axial · non-contrast · 0.45mm/px · z∈[-146,-41]mm · 7 of 29 slices shown, 9 images]
[im 4/29  brain]
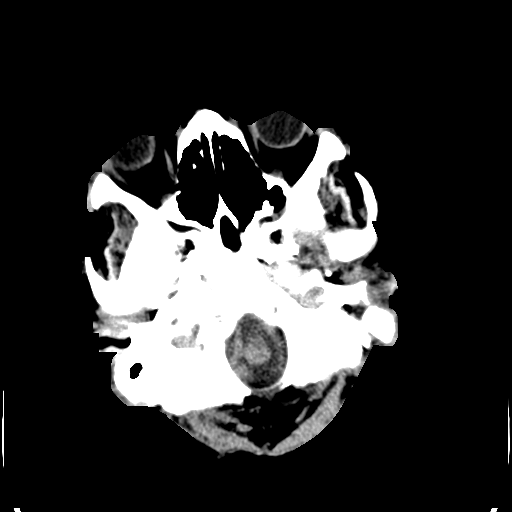
[im 4/29  bone]
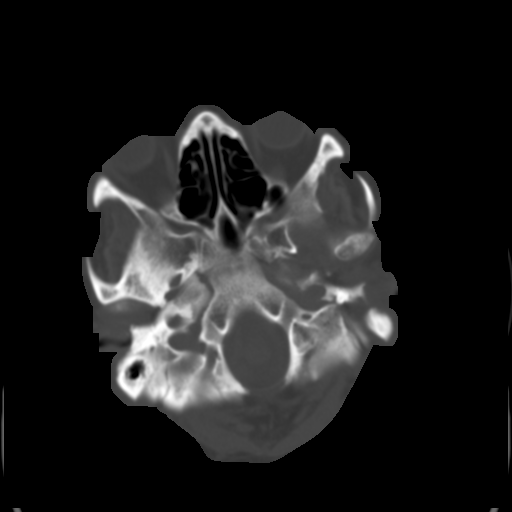
[im 8/29  brain]
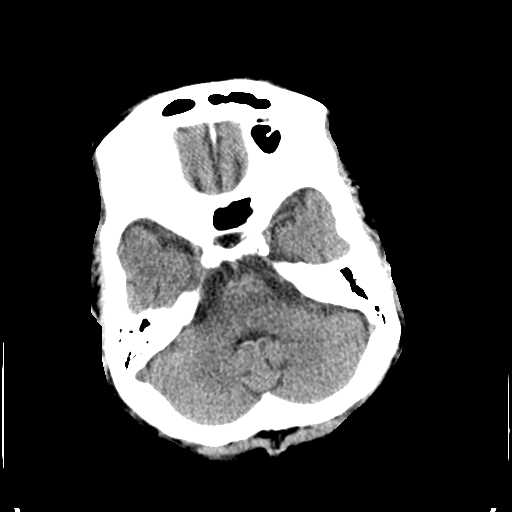
[im 11/29  brain]
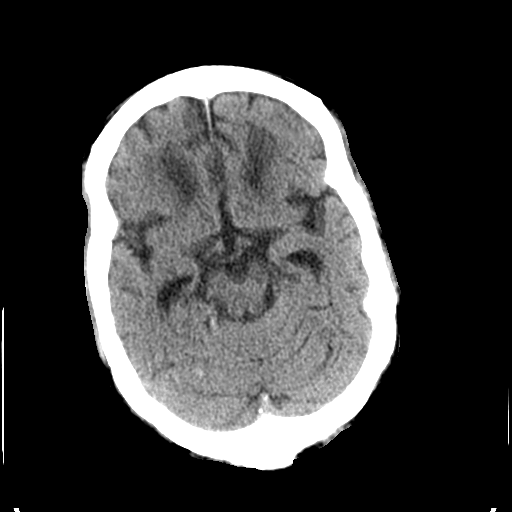
[im 15/29  brain]
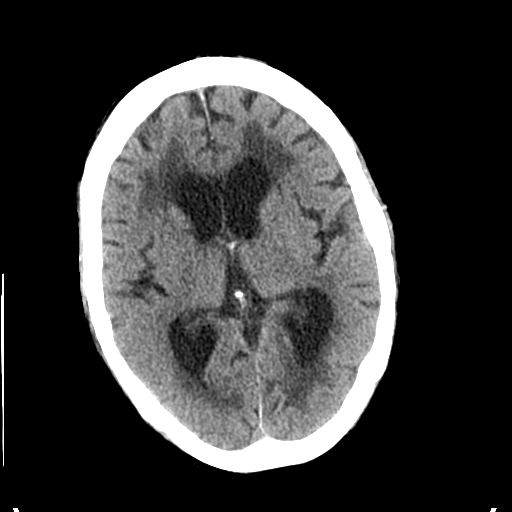
[im 18/29  brain]
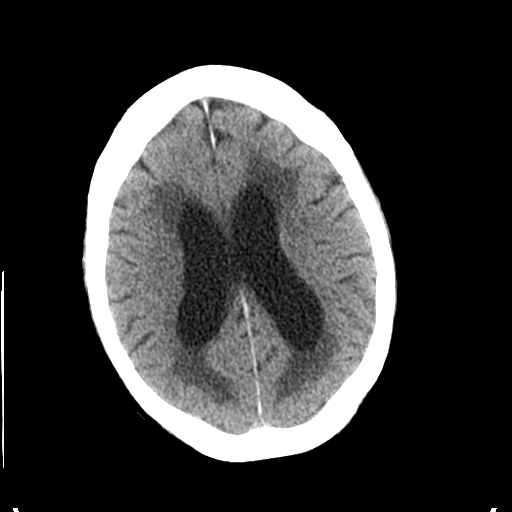
[im 18/29  bone]
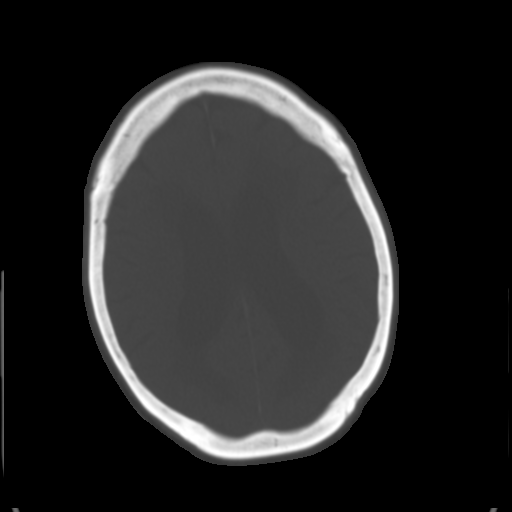
[im 22/29  brain]
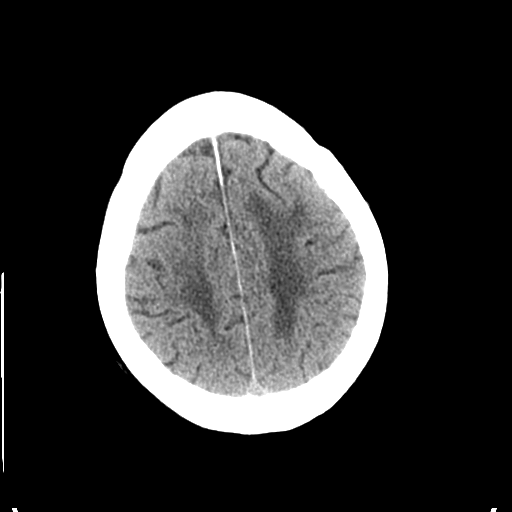
[im 25/29  brain]
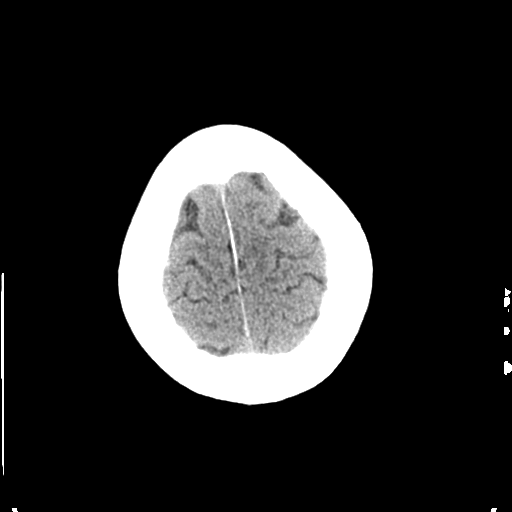

[Series 3: bone windows · axial · 0.45mm/px · z∈[-152,-134]mm · 2 of 48 slices shown]
[im 4/48  bone]
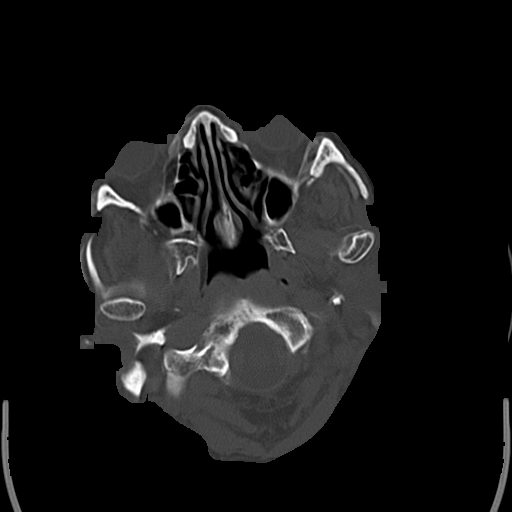
[im 10/48  bone]
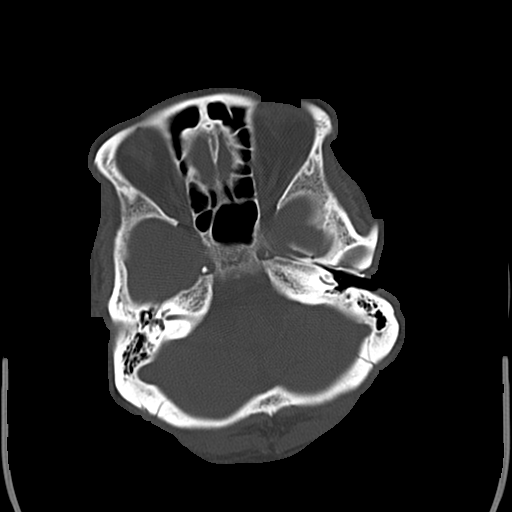

[Series 8: coronal · coronal · 0.28mm/px · 3 of 77 slices shown]
[im 26/77  brain]
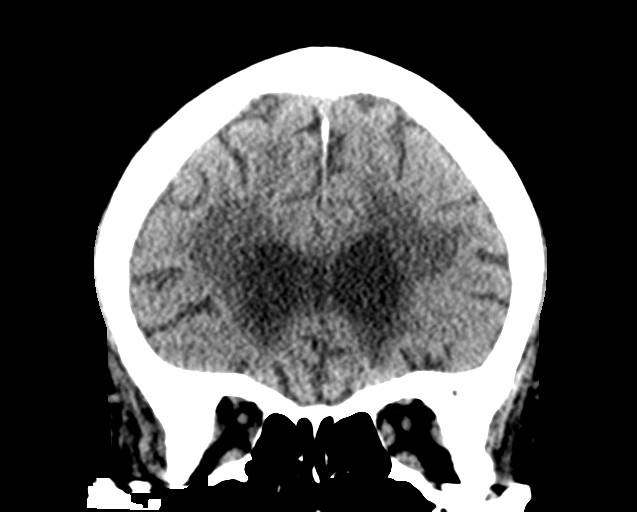
[im 34/77  brain]
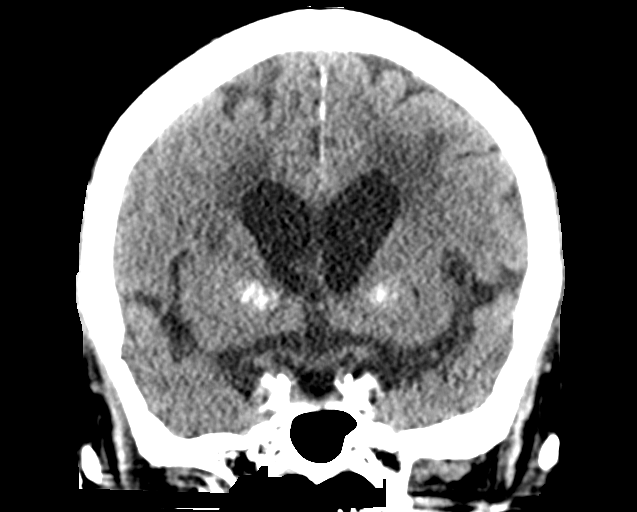
[im 43/77  brain]
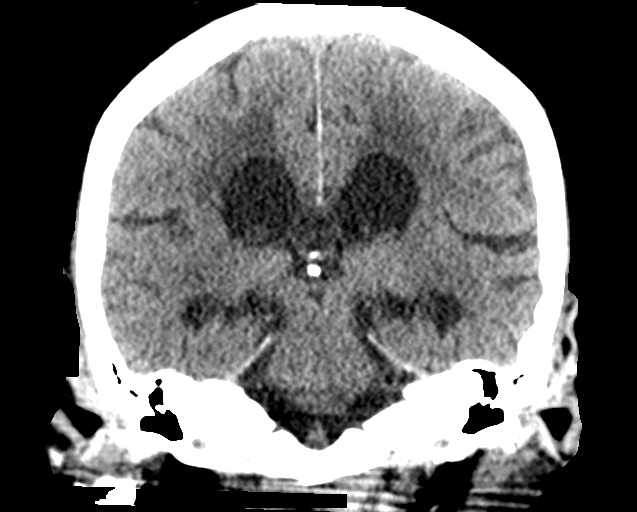

[Series 9: sagittal · sagittal · 0.27mm/px · 3 of 54 slices shown]
[im 18/54  brain]
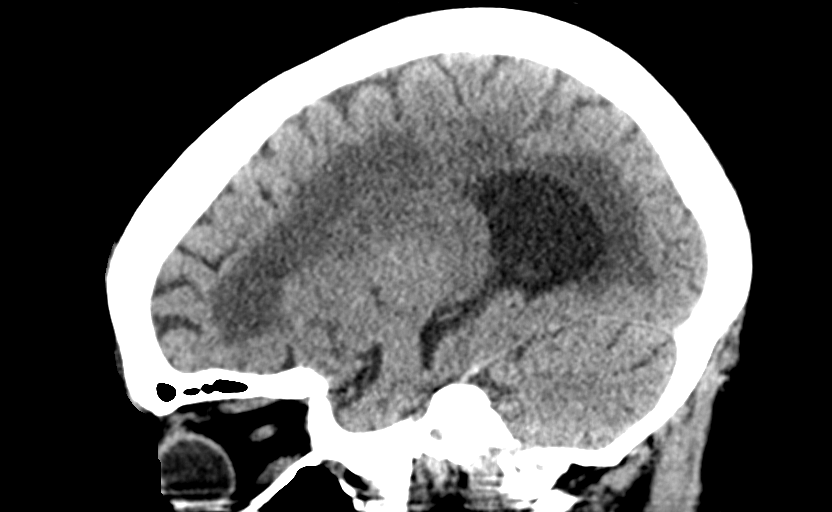
[im 27/54  brain]
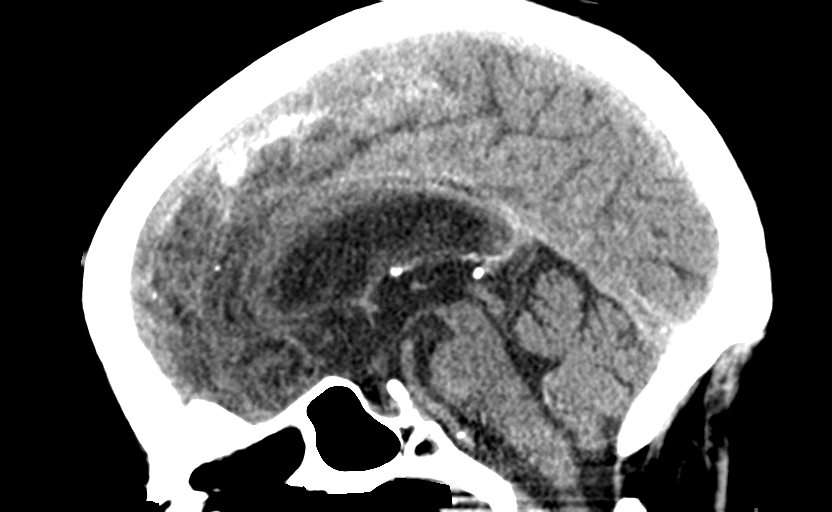
[im 36/54  brain]
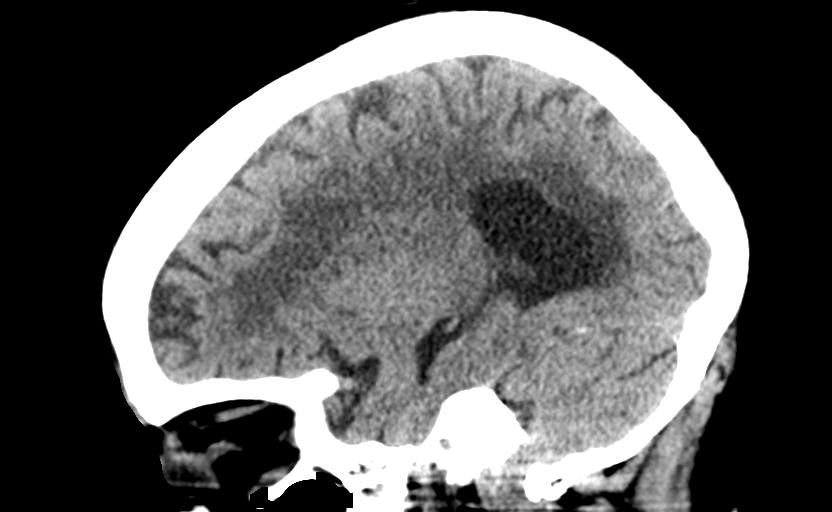

[15 of 47 positions shown; findings below may reference images not displayed]

FINDINGS: Brain: Symmetric dystrophic bilateral globus pallidus (basal
ganglia) calcifications. Confluent bilateral periventricular and
other cerebral white matter hypodensity. Associated mild hypodensity
along the right caudate nucleus. Mild ventricular prominence which
likely is ex vacuo related. Dural calcifications along the
interhemispheric fissure. No midline shift, mass effect, or evidence
of intracranial mass lesion. No acute intracranial hemorrhage
identified. No cortically based acute infarct identified. No
cortical encephalomalacia identified.

Vascular: Calcified atherosclerosis at the skull base. No suspicious
intracranial vascular hyperdensity.

Skull: No acute osseous abnormality identified.

Sinuses/Orbits: Clear.

Other: No acute orbit or scalp soft tissue findings.
IMPRESSION: 1.  No acute intracranial abnormality.
2. Confluent cerebral white matter hypodensity, likely due to
chronic small vessel disease.
3. Mild ventricular prominence is probably ex vacuo related.
